# Patient Record
Sex: Female | Born: 1935 | Race: White | Hispanic: No | Marital: Single | Smoking: Never smoker
Health system: Southern US, Community
[De-identification: ages and names within clinical notes are randomized; demographics above are authoritative.]

## PROBLEM LIST (undated history)

## (undated) DIAGNOSIS — I10 Essential (primary) hypertension: Secondary | ICD-10-CM

## (undated) DIAGNOSIS — E785 Hyperlipidemia, unspecified: Secondary | ICD-10-CM

## (undated) DIAGNOSIS — Z973 Presence of spectacles and contact lenses: Secondary | ICD-10-CM

## (undated) DIAGNOSIS — C189 Malignant neoplasm of colon, unspecified: Secondary | ICD-10-CM

## (undated) DIAGNOSIS — N8110 Cystocele, unspecified: Secondary | ICD-10-CM

## (undated) DIAGNOSIS — C50919 Malignant neoplasm of unspecified site of unspecified female breast: Secondary | ICD-10-CM

## (undated) DIAGNOSIS — F32A Depression, unspecified: Secondary | ICD-10-CM

## (undated) HISTORY — PX: BREAST LUMPECTOMY WITH NEEDLE LOCALIZATION AND AXILLARY LYMPH NODE DISSECTION: SHX5758

## (undated) HISTORY — DX: Hyperlipidemia, unspecified: E78.5

## (undated) HISTORY — PX: COLON SURGERY: SHX602

## (undated) HISTORY — DX: Depression, unspecified: F32.A

## (undated) HISTORY — PX: PARTIAL COLECTOMY: SHX5273

## (undated) HISTORY — PX: CATARACT EXTRACTION W/ INTRAOCULAR LENS  IMPLANT, BILATERAL: SHX1307

## (undated) HISTORY — PX: COLONOSCOPY: SHX174

## (undated) HISTORY — PX: LAPAROSCOPIC SIGMOID COLECTOMY: SHX5928

## (undated) HISTORY — PX: BREAST SURGERY: SHX581

## (undated) HISTORY — PX: LAPAROSCOPIC BILATERAL SALPINGO OOPHERECTOMY: SHX5890

---

## 1998-12-13 ENCOUNTER — Other Ambulatory Visit: Admission: RE | Admit: 1998-12-13 | Discharge: 1998-12-13 | Payer: Self-pay | Admitting: Obstetrics & Gynecology

## 2000-03-11 ENCOUNTER — Other Ambulatory Visit: Admission: RE | Admit: 2000-03-11 | Discharge: 2000-03-11 | Payer: Self-pay | Admitting: Obstetrics & Gynecology

## 2001-03-19 ENCOUNTER — Other Ambulatory Visit: Admission: RE | Admit: 2001-03-19 | Discharge: 2001-03-19 | Payer: Self-pay | Admitting: Obstetrics & Gynecology

## 2002-04-15 ENCOUNTER — Other Ambulatory Visit: Admission: RE | Admit: 2002-04-15 | Discharge: 2002-04-15 | Payer: Self-pay | Admitting: Obstetrics & Gynecology

## 2002-10-06 DIAGNOSIS — Z853 Personal history of malignant neoplasm of breast: Secondary | ICD-10-CM

## 2002-10-06 HISTORY — DX: Personal history of malignant neoplasm of breast: Z85.3

## 2003-05-26 ENCOUNTER — Encounter: Admission: RE | Admit: 2003-05-26 | Discharge: 2003-05-26 | Payer: Self-pay | Admitting: Obstetrics & Gynecology

## 2003-05-26 ENCOUNTER — Other Ambulatory Visit: Admission: RE | Admit: 2003-05-26 | Discharge: 2003-05-26 | Payer: Self-pay | Admitting: Obstetrics & Gynecology

## 2003-05-26 ENCOUNTER — Encounter: Payer: Self-pay | Admitting: Obstetrics & Gynecology

## 2003-05-31 ENCOUNTER — Encounter: Payer: Self-pay | Admitting: General Surgery

## 2003-05-31 ENCOUNTER — Encounter (HOSPITAL_COMMUNITY): Admission: RE | Admit: 2003-05-31 | Discharge: 2003-08-29 | Payer: Self-pay | Admitting: General Surgery

## 2003-06-01 ENCOUNTER — Encounter: Payer: Self-pay | Admitting: General Surgery

## 2003-06-02 ENCOUNTER — Encounter: Admission: RE | Admit: 2003-06-02 | Discharge: 2003-06-02 | Payer: Self-pay | Admitting: General Surgery

## 2003-06-02 ENCOUNTER — Encounter: Payer: Self-pay | Admitting: General Surgery

## 2003-06-05 ENCOUNTER — Encounter (INDEPENDENT_AMBULATORY_CARE_PROVIDER_SITE_OTHER): Payer: Self-pay | Admitting: *Deleted

## 2003-06-05 ENCOUNTER — Encounter: Admission: RE | Admit: 2003-06-05 | Discharge: 2003-06-05 | Payer: Self-pay | Admitting: General Surgery

## 2003-06-05 ENCOUNTER — Ambulatory Visit (HOSPITAL_BASED_OUTPATIENT_CLINIC_OR_DEPARTMENT_OTHER): Admission: RE | Admit: 2003-06-05 | Discharge: 2003-06-05 | Payer: Self-pay | Admitting: General Surgery

## 2003-06-05 ENCOUNTER — Encounter: Payer: Self-pay | Admitting: General Surgery

## 2003-06-19 ENCOUNTER — Encounter: Payer: Self-pay | Admitting: General Surgery

## 2003-06-19 ENCOUNTER — Encounter: Admission: RE | Admit: 2003-06-19 | Discharge: 2003-06-19 | Payer: Self-pay | Admitting: General Surgery

## 2003-06-20 ENCOUNTER — Ambulatory Visit: Admission: RE | Admit: 2003-06-20 | Discharge: 2003-08-05 | Payer: Self-pay | Admitting: *Deleted

## 2003-07-04 ENCOUNTER — Encounter: Admission: RE | Admit: 2003-07-04 | Discharge: 2003-07-04 | Payer: Self-pay | Admitting: General Surgery

## 2003-07-04 ENCOUNTER — Encounter: Payer: Self-pay | Admitting: General Surgery

## 2003-10-04 ENCOUNTER — Encounter: Admission: RE | Admit: 2003-10-04 | Discharge: 2003-10-04 | Payer: Self-pay | Admitting: Thoracic Surgery

## 2003-11-11 ENCOUNTER — Ambulatory Visit: Admission: RE | Admit: 2003-11-11 | Discharge: 2004-01-12 | Payer: Self-pay | Admitting: *Deleted

## 2004-02-23 ENCOUNTER — Ambulatory Visit: Admission: RE | Admit: 2004-02-23 | Discharge: 2004-02-23 | Payer: Self-pay | Admitting: *Deleted

## 2004-03-05 ENCOUNTER — Encounter: Admission: RE | Admit: 2004-03-05 | Discharge: 2004-03-05 | Payer: Self-pay | Admitting: Thoracic Surgery

## 2004-05-27 ENCOUNTER — Encounter: Admission: RE | Admit: 2004-05-27 | Discharge: 2004-05-27 | Payer: Self-pay | Admitting: Oncology

## 2004-06-18 ENCOUNTER — Other Ambulatory Visit: Admission: RE | Admit: 2004-06-18 | Discharge: 2004-06-18 | Payer: Self-pay | Admitting: Obstetrics & Gynecology

## 2004-08-27 ENCOUNTER — Encounter: Admission: RE | Admit: 2004-08-27 | Discharge: 2004-08-27 | Payer: Self-pay | Admitting: Thoracic Surgery

## 2004-10-06 DIAGNOSIS — Z85038 Personal history of other malignant neoplasm of large intestine: Secondary | ICD-10-CM

## 2004-10-06 HISTORY — DX: Personal history of other malignant neoplasm of large intestine: Z85.038

## 2004-11-29 ENCOUNTER — Ambulatory Visit: Payer: Self-pay | Admitting: Oncology

## 2004-12-05 ENCOUNTER — Ambulatory Visit (HOSPITAL_COMMUNITY): Admission: RE | Admit: 2004-12-05 | Discharge: 2004-12-05 | Payer: Self-pay | Admitting: Gastroenterology

## 2004-12-05 ENCOUNTER — Encounter (INDEPENDENT_AMBULATORY_CARE_PROVIDER_SITE_OTHER): Payer: Self-pay | Admitting: Specialist

## 2004-12-18 ENCOUNTER — Encounter (INDEPENDENT_AMBULATORY_CARE_PROVIDER_SITE_OTHER): Payer: Self-pay | Admitting: Specialist

## 2004-12-18 ENCOUNTER — Inpatient Hospital Stay (HOSPITAL_COMMUNITY): Admission: RE | Admit: 2004-12-18 | Discharge: 2004-12-21 | Payer: Self-pay | Admitting: General Surgery

## 2005-03-04 ENCOUNTER — Encounter: Admission: RE | Admit: 2005-03-04 | Discharge: 2005-03-04 | Payer: Self-pay | Admitting: Thoracic Surgery

## 2005-05-30 ENCOUNTER — Encounter: Admission: RE | Admit: 2005-05-30 | Discharge: 2005-05-30 | Payer: Self-pay | Admitting: General Surgery

## 2005-07-25 ENCOUNTER — Other Ambulatory Visit: Admission: RE | Admit: 2005-07-25 | Discharge: 2005-07-25 | Payer: Self-pay | Admitting: Obstetrics & Gynecology

## 2005-08-01 ENCOUNTER — Encounter: Admission: RE | Admit: 2005-08-01 | Discharge: 2005-08-01 | Payer: Self-pay | Admitting: Family Medicine

## 2005-08-18 ENCOUNTER — Encounter: Admission: RE | Admit: 2005-08-18 | Discharge: 2005-08-18 | Payer: Self-pay | Admitting: Family Medicine

## 2005-10-08 ENCOUNTER — Encounter (INDEPENDENT_AMBULATORY_CARE_PROVIDER_SITE_OTHER): Payer: Self-pay | Admitting: Specialist

## 2005-10-08 ENCOUNTER — Ambulatory Visit (HOSPITAL_COMMUNITY): Admission: RE | Admit: 2005-10-08 | Discharge: 2005-10-08 | Payer: Self-pay | Admitting: Obstetrics & Gynecology

## 2006-06-11 ENCOUNTER — Encounter: Admission: RE | Admit: 2006-06-11 | Discharge: 2006-06-11 | Payer: Self-pay | Admitting: Obstetrics & Gynecology

## 2007-05-09 IMAGING — CT CT ABDOMEN W/ CM
1 of 3 series · 14 of 32 positions shown, 19 images · IV contrast (READICAT/WATER & [ID] OMNI 300)
Comparison: Chest CT 03/04/2005

ABDOMEN CT WITH CONTRAST

CLINICAL DATA: Elevated liver enzymes. History of breast cancer and colon
cancer.
TECHNIQUE: Multidetector CT imaging of the abdomen and pelvis was performed
following the standard protocol during bolus administration of intravenous
contrast.

Contrast:  100 cc Omnipaque 300

[Series 2: — · axial · 0.70mm/px · z∈[-418,-8]mm · 14 of 94 slices shown, 19 images]
[im 6/94  soft-tissue]
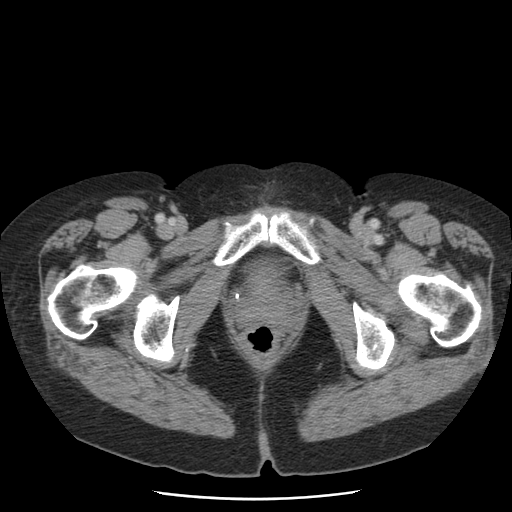
[im 6/94  bone]
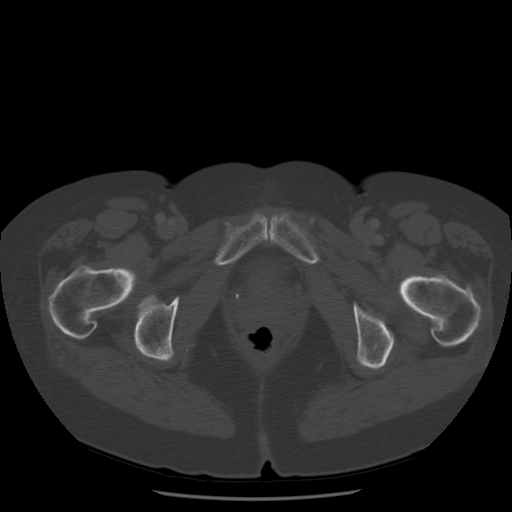
[im 11/94  soft-tissue]
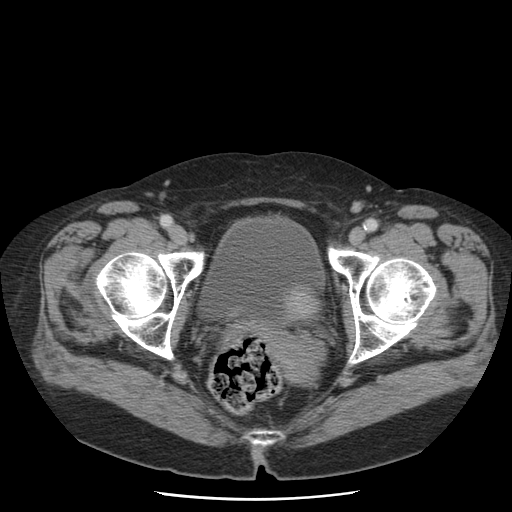
[im 21/94  soft-tissue]
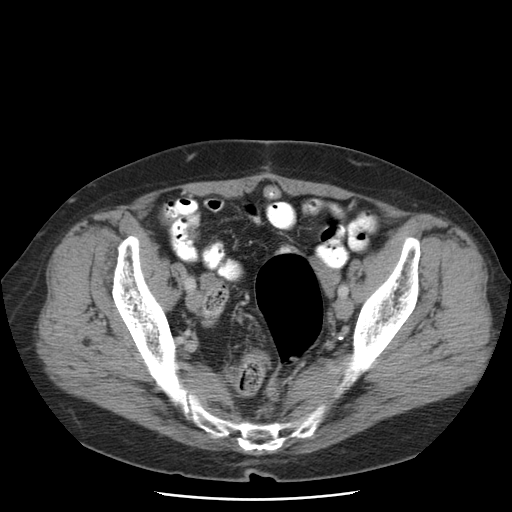
[im 26/94  soft-tissue]
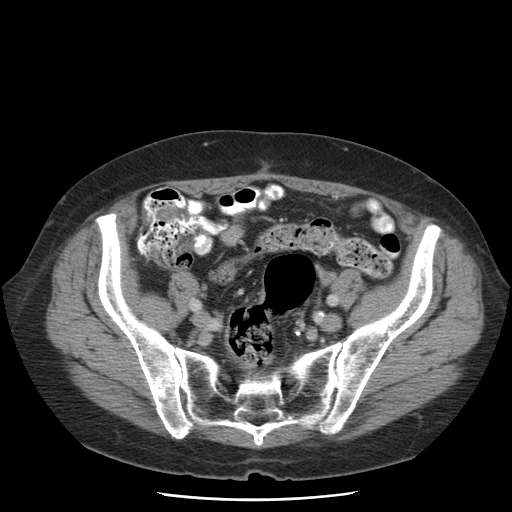
[im 32/94  soft-tissue]
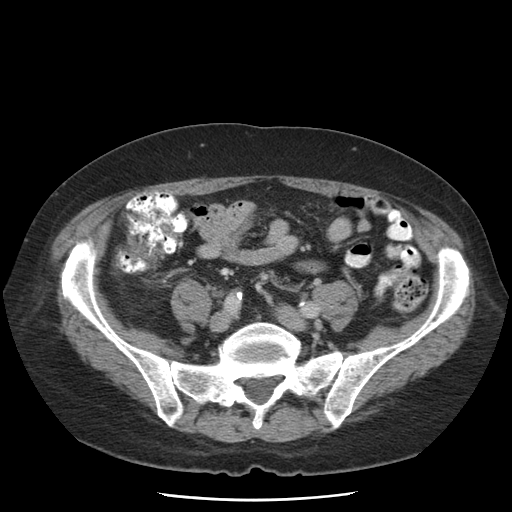
[im 42/94  soft-tissue]
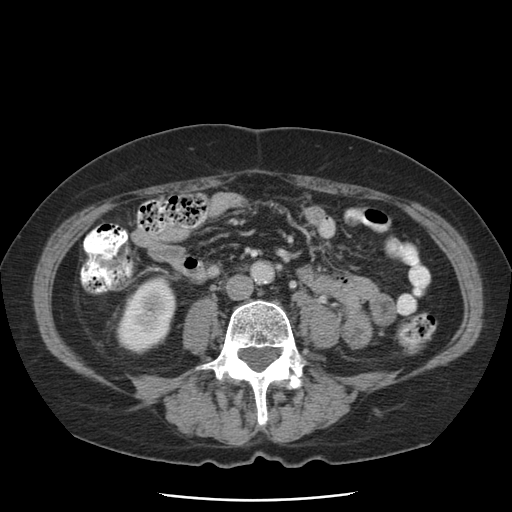
[im 47/94  soft-tissue]
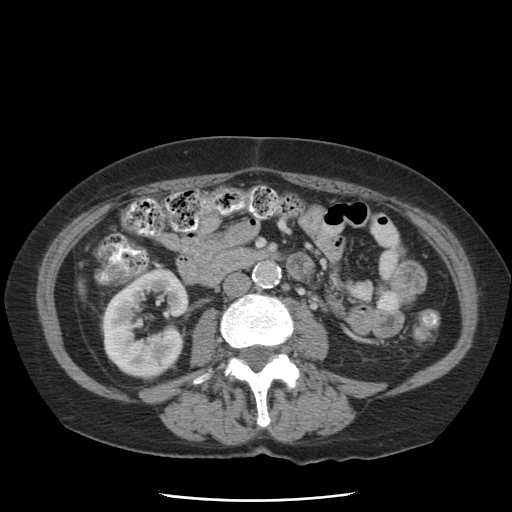
[im 52/94  soft-tissue]
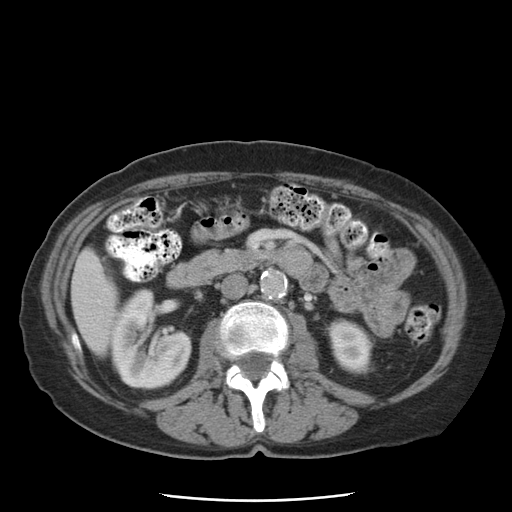
[im 63/94  soft-tissue]
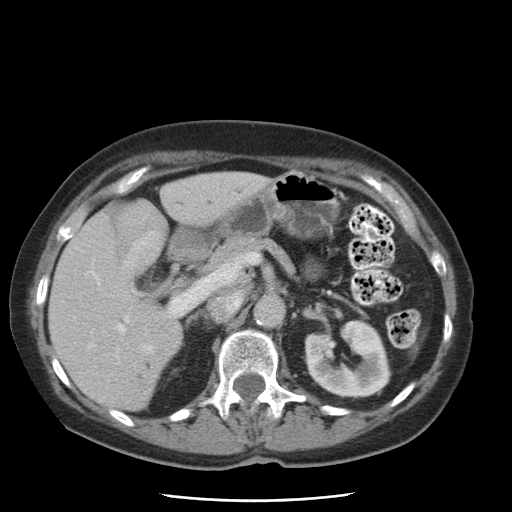
[im 63/94  bone]
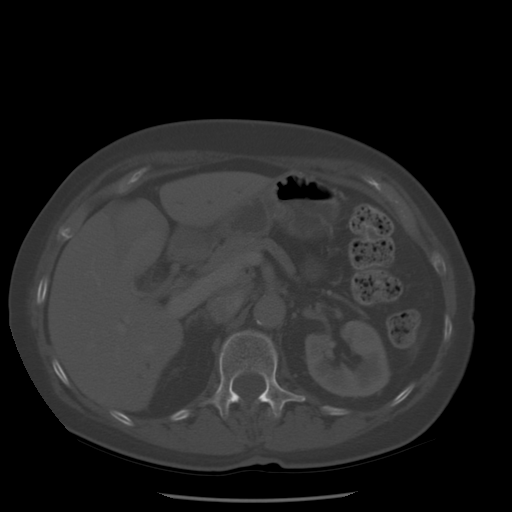
[im 68/94  soft-tissue]
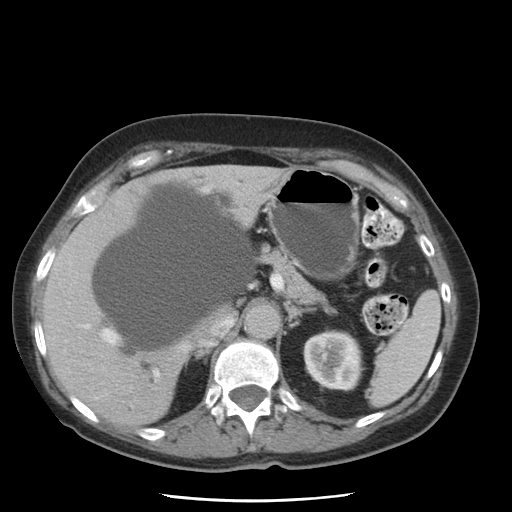
[im 73/94  soft-tissue]
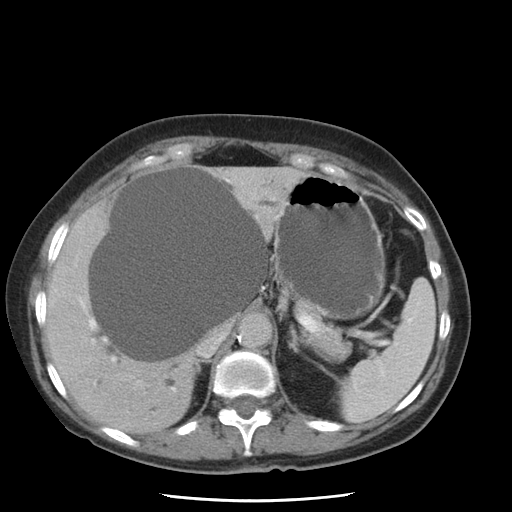
[im 73/94  lung]
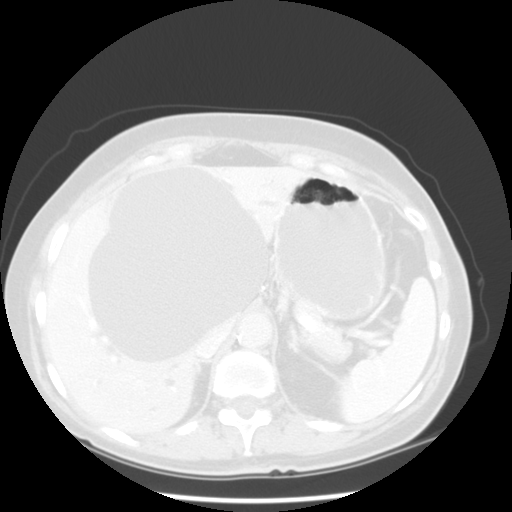
[im 78/94  lung]
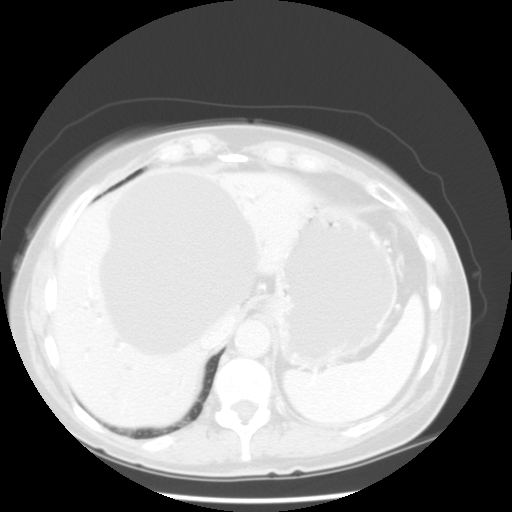
[im 83/94  soft-tissue]
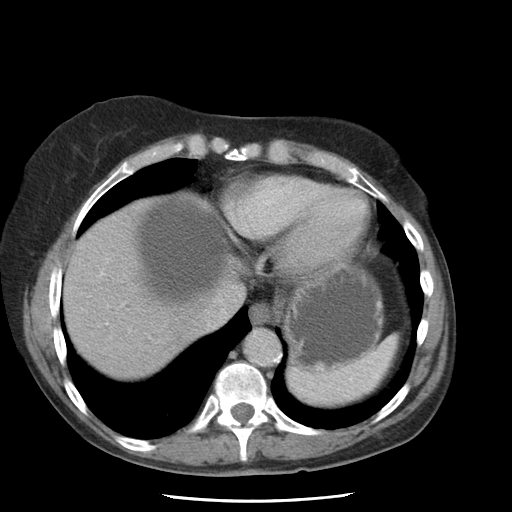
[im 83/94  lung]
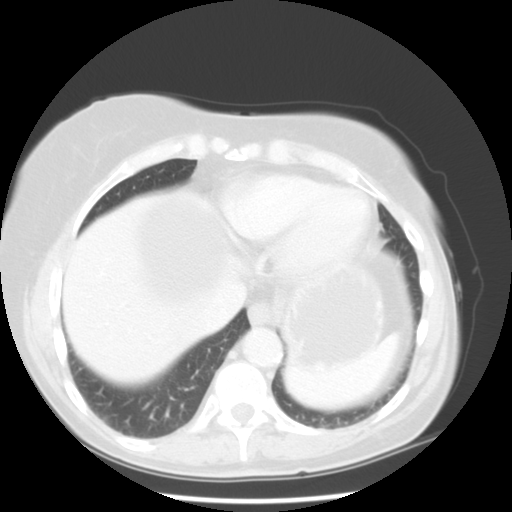
[im 88/94  soft-tissue]
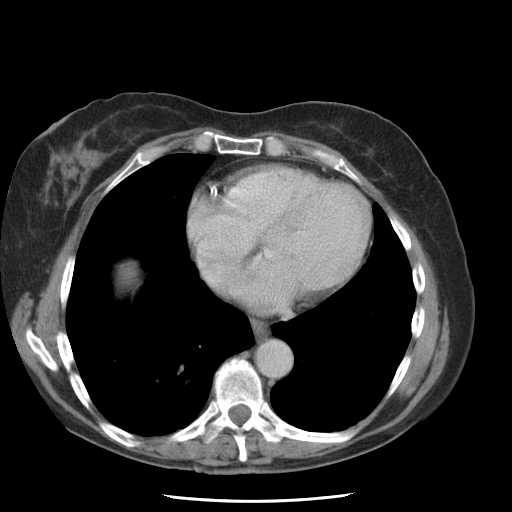
[im 88/94  lung]
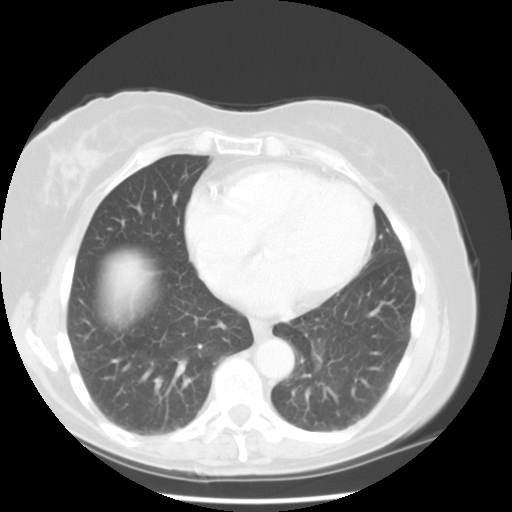

[14 of 32 positions shown; findings below may reference images not displayed]

FINDINGS: Again noted is a large central simple appearing hepatic cyst. This
may be slightly increased in size since prior chest CT. This now measures 12.3 x
13.8 cm, compared to 11.8 x 13.3 cm previously. This remains water density.
Stable intrahepatic biliary ductal dilatation. Spleen, pancreas, adrenals,
kidneys unremarkable. Bowel grossly unremarkable. No free fluid, free air, or
adenopathy.

8mm nodule again noted in the inferior right middle lobe just above the right
hemidiaphragm, stable since prior study. Left lung base clear.

IMPRESSION

Slight increase in the size of the large central hepatic cysts. Stable
intrahepatic biliary ductal dilatation. Maximum diameter now 13.8 cm.

PELVIS CT WITH CONTRAST
FINDINGS: No free fluid, free air, or adenopathy. 1.6 cm low density area
within the right adnexa, likely a small right ovarian cyst. Probable fibroid
within the uterus. However I would recommend pelvic ultrasound to confirm both
of the above findings.

IMPRESSION

4.2 cm slightly hyperdense area within the uterus which may represent a fibroid,
but recommend ultrasound confirmation. 1.6 cm low density lesion within the
right ovary. This can be evaluated with ultrasound as well.

## 2007-06-16 ENCOUNTER — Encounter: Admission: RE | Admit: 2007-06-16 | Discharge: 2007-06-16 | Payer: Self-pay | Admitting: Obstetrics & Gynecology

## 2008-06-16 ENCOUNTER — Encounter: Admission: RE | Admit: 2008-06-16 | Discharge: 2008-06-16 | Payer: Self-pay | Admitting: General Surgery

## 2009-06-18 ENCOUNTER — Encounter: Admission: RE | Admit: 2009-06-18 | Discharge: 2009-06-18 | Payer: Self-pay | Admitting: Family Medicine

## 2009-09-03 ENCOUNTER — Encounter: Admission: RE | Admit: 2009-09-03 | Discharge: 2009-09-03 | Payer: Self-pay | Admitting: Obstetrics & Gynecology

## 2010-06-20 ENCOUNTER — Encounter: Admission: RE | Admit: 2010-06-20 | Discharge: 2010-06-20 | Payer: Self-pay | Admitting: Family Medicine

## 2011-02-21 NOTE — Op Note (Signed)
Kathy Carpenter, Kathy Carpenter                ACCOUNT NO.:  [id number]   MEDICAL RECORD NO.:  [id number]          PATIENT TYPE:  INP   LOCATION:  X004                         FACILITY:  [facility]   PHYSICIAN:  Ollen Gross. Vernell Morgans, M.D. DATE OF BIRTH:  December 27, 1934   DATE OF PROCEDURE:  12/18/2004  DATE OF DISCHARGE:                                 OPERATIVE REPORT   PREOPERATIVE DIAGNOSIS:  Sigmoid colon cancer.   POSTOPERATIVE DIAGNOSIS:  Sigmoid colon cancer.   PROCEDURE:  Laparoscopic-assisted sigmoid colectomy and rigid sigmoidoscopy.   SURGEON:  Ollen Gross. Carolynne Edouard, M.D.   ASSISTANT:  Lebron Conners, M.D.   ANESTHESIA:  General endotracheal.   PROCEDURE:  After informed consent was obtained, the patient was brought to  the operating room and placed in a supine position on the operating room  table.  After the adequate induction of general anesthesia, the patient was  placed in the lithotomy position, and her abdomen and perineum were prepped  with Betadine and draped in the usual sterile manner.  The area just above  the umbilicus on the midline was infiltrated with 0.25% Marcaine.  A small  incision was made with a 15 blade knife.  This incision was carried down  through the subcutaneous tissue bluntly with a Kelly clamp and Omni  retractors until the linea alba was identified.  The linea alba was incised  with a 15 blade knife, and each side was grasped with Kocher clamps and  elevated anteriorly.  The pre-peritoneal space was then entered bluntly with  a hemostat until the peritoneum was opened and access was gained into the  abdominal cavity.  A 0 Vicryl purse-string stitch was placed in the fascia  surrounding the opening.  A Hasson cannula was placed through the opening  and anchored in placed with the previously placed Vicryl purse-string  stitch.  The abdomen was then insufflated with carbon dioxide without  difficulty.  The laparoscope was placed through the Hasson cannula, and the  pelvis  and left lower quadrant were examined.  The patient was placed in  Trendelenburg position and rotated with the left side up.  This allowed  pretty good visualization of the sigmoid colon.  Next, in the right lower  quadrant, a site for a 5 mm port was chosen.  The area was infiltrated with  0.25% Marcaine, and a small incision was made with a 15 blade knife, and a 5  mm port was placed bluntly through this incision into the abdominal cavity  under direct vision.  Another site in the left mid abdomen was chosen for  another port.  This area was infiltrated with 0.25% Marcaine, and a small  incision was made with a 15 blade knife.  A 10 mm port was placed bluntly  through this incision into the abdominal cavity under direct vision.  The  laparoscope was then moved to the left mid abdomen port and blunt graspers  were used to run the sigmoid colon down towards the rectum.  No area that  had been tattooed with [place] ink could be identified.  At this  point, I  performed a rigid sigmoidoscopy and using the rigid sigmoidoscope, I was  able to identify the area with [place] ink.  This area was grasped with a  Kingsley Spittle and then marked with an endo stitch.  Next, the sigmoid colon was  mobilized by incising its retroperitoneal attachment along the white line of  Toldt using the harmonic scalpel.  Once this was accomplished, the sigmoid  colon was extremely floppy and was well mobilized.  At this point, a lower  midline incision was made with a 10 blade knife.  This incision was carried  down through the skin and subcutaneous tissues sharply until the linea alba  was identified.  The linea alba was also incised with the electrocautery.  The pre-peritoneal space was opened sharply with electrocautery as well  until the abdomen was entered, and the rest of the incision was opened under  direct vision.  The sigmoid colon was easy to identify through this small  incision and was very mobile and able to be  brought out through this  incision.  The area with the stitch was identified.  A site was chosen, both  proximal and distal to where the stitch was that allowed a good margin, and  the mesentery at each of these points was opened sharply with the  electrocautery.  Allen clamps were placed across the colon, both proximally  and distally.  The mesentery to this segment of sigmoid colon was then taken  down by serially clamping the vessels of this mesentery, dividing with  Metzenbaum scissors and ligating with 2-0 silk ties in each of these vessels  of the mesentery, taking them somewhat down low on the mesentery but  preserving the blood supply to the rest of the colon and rectum.  Once this  was accomplished, the colon was divided proximally and distally on the Allen  clamp and removed and sent to pathology for further evaluation.  The Allen  clamps were removed.  The bowel wall appeared to be healthy.  The inside of  the bowel was inspected, and there were no polyp or [place] ink areas noted  that were visualized in that area.  An end-to-end anastomosis was then  created using full-thickness interrupted 3-0 silk stitches, keeping the  knots on the inside.  The posterior wall was done first, followed by the  anterior wall.  Once this was accomplished, the anastomosis appeared to be  healthy, without any tension, and widely patent.  The mesenteric defect was  closed with interrupted 2-0 silk stitches.  The bowel was then allowed to  drop back into the abdominal cavity.  The head of the abdominal cavity was  irrigated with copious amounts of saline.  The lower midline incision was  then closed with a running #1 PDS.  Insufflation was then returned to the  abdomen, and the laparoscope was placed through the Hasson cannula.  The  lower pelvis area was inspected.  The midline incision was closed nicely,  and the operative area appeared to be hemostatic.  No other abnormalities were noted on quick  visualization of the rest of the abdominal cavity.  The  Hasson cannula was then removed, and the fascial defect was closed with the  previously placed Vicryl purse-string stitch as well as with another figure-  of-eight 0 Vicryl stitch.  This was done under direct vision with the  laparoscope in the left lateral abdominal port.  The rest of the ports were  then removed under direct  vision.  The gas was allowed to escape.  All areas  were completely hemostatic.  The skin incisions were closed with staples  after irrigating with Betadine and saline, and sterile dressings were  applied.  The patient tolerated the procedure well.  At the end of the case,  all sponge, needle, and instrument counts were correct.  The patient was  then awakened and taken to the recovery room in stable condition.      PST/MEDQ  D:  12/18/2004  T:  12/18/2004  Job:  [id number]

## 2011-02-21 NOTE — Discharge Summary (Signed)
NAMEJOHNISHA, LOUKS                ACCOUNT NO.:  [id number]   MEDICAL RECORD NO.:  [id number]          PATIENT TYPE:  INP   LOCATION:  0466                         FACILITY:  [facility]   PHYSICIAN:  Ollen Gross. Vernell Morgans, M.D. DATE OF BIRTH:  1935/07/03   DATE OF ADMISSION:  12/18/2004  DATE OF DISCHARGE:  12/21/2004                                 DISCHARGE SUMMARY   Ms. Marcus is a 76 year old white female, who was found on screening  colonoscopy to have a sigmoid colon cancer.  She underwent a laparoscopic-  assisted sigmoid colectomy on March 15.  She tolerated the operation well.  She was started on liquids on March 16.  On March 17, she had good bowel  sounds and, on March 18, she was passing flatus and tolerating a diet, was  ready for discharge home.   DISCHARGE MEDICATIONS:  1. She is to resume her home medications.  2. She was given a prescription for Vicodin for pain.     ACTIVITY:  No heavy lifting.   DIET:  No restrictions.   FINAL DIAGNOSIS:  Sigmoid colon cancer.   CONDITION:  Stable.   FOLLOW UP:  With Dr. Carolynne Edouard in a week for staple removal, and she is  discharged home.      PST/MEDQ  D:  01/28/2005  T:  01/28/2005  Job:  [id number]

## 2011-02-21 NOTE — Op Note (Signed)
NAME:  Kathy Carpenter, Kathy Carpenter                ACCOUNT NO.:  [id number]   MEDICAL RECORD NO.:  [id number]          PATIENT TYPE:  AMB   LOCATION:  SDC                           FACILITY:  [facility]   PHYSICIAN:  Ilda Mori, M.D.   DATE OF BIRTH:  May 25, 1935   DATE OF PROCEDURE:  10/08/2005  DATE OF DISCHARGE:                                 OPERATIVE REPORT   PREOPERATIVE DIAGNOSIS:  A complex right adnexal mass.   POSTOPERATIVE DIAGNOSIS:  A complex right adnexal mass.   PROCEDURE:  Laparoscopic bilateral salpingo-oophorectomy.   SURGEON:  Ilda Mori, M.D.   ASSISTANT:  Luvenia Redden, M.D.   ANESTHESIA:  General.   ESTIMATED BLOOD LOSS:  Minimal.   FINDINGS:  The right ovary was slightly enlarged but no cyst was seen. There  was a small excrescence over the right ovary. The tubes appeared perfectly  normal.  The uterus appeared normal. The pelvis was free of adhesions or  pathology. The upper abdomen appeared normal. The gallbladder was full but  appeared normal and her liver edge looked completely normal.   INDICATIONS:  This is a 76 year old woman with a history of breast cancer  diagnosed in 2004 and colon cancer diagnosed in 2006. During a CT scan for  abdominal symptomatology, a complex right ovarian cyst was noted with blood  flow in small nodules within the cyst.  A CA-125 was done and came back  normal. The case was discussed with Dr. De Blanch and radiology,  Dr. Elly Modena.  It was felt that with this patient's history of cancer  and a suspicious nodule, that removal of the ovary was indicated. These  findings were discussed with the patient who elected to proceed. Because it  was felt that this was most likely a benign lesion, the decision was made to  do a two-step procedure proceeding with laparoscopic oophorectomy as minimal  invasive surgery.  If on permanent section the right ovary was indeed  cancerous, then a staging laparotomy would be performed by  the GYN  oncologist.   PROCEDURE:  The patient was taken to the operating room and placed in the  supine position and general endotracheal anesthesia was induced. She was  then placed in modified dorsal supine position and the lower abdomen,  umbilical area, and vulva and vagina were prepped and draped in sterile  fashion. The bladder was emptied and intact.  A tenaculum was placed through  the endocervical canal and affixed to the anterior lip of the cervix for  uterine mobility.  The surgeon re-gowned and gloved.  An incision was made  at the base of the umbilicus and carried down to the fascia. The fascia was  then sharply incised. It was sutured with a loose pursestring suture. The  peritoneum was entered and a Hasson cannula was placed into the peritoneal  cavity. A pneumoperitoneum was created. Accessory instruments were placed in  the right and left lower quadrants lateral to the epigastric vessels. The  pelvis was viewed with the findings noted above. Pelvic washings were  obtained.  The ureters were identified  bilaterally. The infundibulopelvic  ligament was then clamped with a tripolar cautery unit, cauterized and cut.  The dissection was then carried removing down to the uterine ovarian  anastomosis so that the tube and ovary were free on the right side. The  specimen was then placed in the anterior cul-de-sac.  An identical procedure  was then carried out on the left adnexa. The laparoscope was then removed  from the umbilical port. A bag was placed in the umbilical port. The  laparoscope was placed in the right lower quadrant. The bag was opened and  the ovaries were placed both in the same bag. The bag was then closed and  was removed through the umbilical incision. The pedicles were then inspected  through the 5 mm scope and were dry and the pursestring suture that had been  placed previously was tied to close the fascia at the umbilical site and the  skin over the  umbilical incision was closed with subcuticular 4-0 Dexon  suture. The accessory trocars were then removed in the right and left lower  quadrant and these incisions were closed with Dermabond.  The procedure was  then terminated. The patient left the operating room in good condition.      Ilda Mori, M.D.  Electronically Signed     RK/MEDQ  D:  10/08/2005  T:  10/08/2005  Job:  [id number]

## 2011-02-21 NOTE — Op Note (Signed)
NAME:  Kathy Carpenter, Kathy Carpenter             ACCOUNT NO.:  [id number]   MEDICAL RECORD NO.:  [id number]                   PATIENT TYPE:  AMB   LOCATION:  DSC                                  FACILITY:  [facility]   PHYSICIAN:  Ollen Gross. Vernell Morgans, M.D.              DATE OF BIRTH:  06-18-1935   DATE OF PROCEDURE:  06/05/2003  DATE OF DISCHARGE:                                 OPERATIVE REPORT   PREOPERATIVE DIAGNOSIS:  Left breast cancer.   POSTOPERATIVE DIAGNOSIS:  Left breast cancer.   PROCEDURE:  Left breast needle-localized lumpectomy and sentinel node  biopsy.   SURGEON:  Ollen Gross. Carolynne Edouard, M.D.   ANESTHESIA:  General endotracheal.   DESCRIPTION OF PROCEDURE:  After informed consent was obtained, the patient  was brought to the operating room and placed in the supine position on the  operating table.  After adequate induction of general anesthesia, the  patient's left chest, axilla, and breast area were prepped with Betadine and  draped in the usual sterile manner.  Initially the Neoprobe 2000 was used to  determine the baseline counts.  The breast had a count of about 9000 at the  injection site.  There was a single area in the axilla where increased  uptake was encountered with counts of around 30.  At this point the  Lymphazurin blue dye was injected subareolar and into the dermal tissues in  the inferior outer quadrant of the nipple-areolar complex.  Approximately 5  mL were used.  The breast was then gently massaged for five minutes.  Prior  to coming to the operating room, the patient had had a wire placed within  the tumor in question and care was taken not to disrupt the placement of  this wire.  After five minutes a small incision was made transversely at the  inferior edge of the axilla.  This incision was carried down through the  skin and subcutaneous tissue sharply with the electrocautery.  Blunt  dissection was then carried out into this area and into the axilla.  The  direction of the dissection was determined using the Neoprobe 2000.  Low in  the axilla there was a single lymph node I identified that was bright blue  with the Lymphazurin blue dye.  It also had counts of around 140.  The  efferent and afferent lymphatics to this lymph node were clamped with  hemostats, divided, and ligated with 3-0 Vicryl ties and at this point the  lymph node was removed from the patient.  The background counts in the  axilla at this point were basically zero.  The ex vivo count on the hot blue  lymph node that was identified as sentinel node 1 was about 140.  At this  point the sentinel node was sent to pathology for touch preps and a clean  gauze was packed in the cavity.  Next attention was turned to the breast.  A  curvilinear incision was made just  inferior to the nipple-areolar complex  overlying the area where the tumor was identified.  This incision was  carried down through the skin and subcutaneous tissue into the breast  tissues using Bovie electrocautery.  Sharp dissection with the  electrocautery was used to surround the end of the wire widely in what  appeared to be normal breast tissue visually.  This specimen was then sent  to pathology for touch preps of the margins.  Separate specimens from the  inferior, superior, deep, medial, and lateral margins were sent, all labeled  separately.  These margins were also touch-prepped and after some time,  pathology called back to the room and indicated they thought they saw  atypical cells on the medial margin.  At this point a new medial margin was  removed and labeled 1A as the new medial margin, 1B as the new medial  inferior margin, and 1C as the new medial superior margin.  The rest of  the cavity was inspected and palpated and felt to be all normal breast  tissue.  Hemostasis was achieved using the electrocautery.  The wound was  irrigated with saline.  The skin was then closed with a running 4-0 Monocryl   subcuticular stitch, Benzoin and Steri-Strips were applied.  At this point  gloves were changed and several instruments were kept clean, and these  instruments were used to close the axillary incision using a running 4-0  Monocryl subcuticular stitch.  Again Benzoin and Steri-Strips  and sterile dressings were applied.  The patient tolerated the procedure  well.  At the end of the case all needle, sponge, and instrument counts were  correct.  The patient was then awakened and taken to the recovery room in  stable condition.                                               Ollen Gross. Vernell Morgans, M.D.    PST/MEDQ  D:  06/06/2003  T:  06/06/2003  Job:  [id number]

## 2011-02-21 NOTE — Op Note (Signed)
NAMEAARTI, MANKOWSKI                ACCOUNT NO.:  [id number]   MEDICAL RECORD NO.:  [id number]          PATIENT TYPE:  AMB   LOCATION:  ENDO                         FACILITY:  [facility]   PHYSICIAN:  Bernette Redbird, M.D.   DATE OF BIRTH:  20-Aug-1935   DATE OF PROCEDURE:  12/05/2004  DATE OF DISCHARGE:                                 OPERATIVE REPORT   PROCEDURE:  Flexible sigmoidoscopy with biopsies and directed submucosal  injection.   INDICATIONS:  Mrs. Blahnik is three days status post a screening  colonoscopy, during which time eight polyps were removed.  One of them, in  the lower colon at about 20 or 25 cm, was a semipedunculated, slightly  loculated, not particularly worrisome-appearing, slightly lobulated polyp,  which was snared off after submucosal injection.  It came back with  adenocarcinoma within it, and due to fragmentation of the polyps, it is not  clear whether there was submucosal invasion or a clear margin.  It was  therefore felt important to tattoo the polypectomy site for future reference  in case surgery might be needed, and to obtain further biopsies from the  base of the polypectomy site to look for any evidence of residual polyp  tissue.   FINDINGS:  Eschar readily visible, biopsied and tattooed proximally and  distally.   PROCEDURE:  The patient was familiar with the procedure and provided written  consent. No sedation was administered.  The Olympus adjustable-tension  pediatric video colonoscope was advanced easily following a Fleet's enema  prep, and there was little residual stool present.  The eschar corresponding  to the previous polypectomy site was readily identified and tattooed  proximal and distally with [place] ink, after which approximately 10 biopsies  were obtained from the margins and base of the polypectomy site.   A KUB was obtained with the tip the scope in the region of the polypectomy  site for anatomic localization.   The scope was then  removed from the patient.  She tolerated the procedure  well, and there no apparent complications.   IMPRESSION:  Malignant sigmoid polyp with indeterminate margin.   PLAN:  Await pathology results.  Review previous pathology.  I will consult  with patient's oncologist and decide whether or not surgical resection of  that area is warranted.      RB/MEDQ  D:  12/05/2004  T:  12/05/2004  Job:  [id number]   cc:   Valentino Hue. Magrinat, M.D.  [address] [facility]  [place]  [place] [address]  Fax: [phone]

## 2011-06-30 ENCOUNTER — Other Ambulatory Visit: Payer: Self-pay | Admitting: Family Medicine

## 2011-06-30 DIAGNOSIS — Z1231 Encounter for screening mammogram for malignant neoplasm of breast: Secondary | ICD-10-CM

## 2011-07-07 ENCOUNTER — Ambulatory Visit
Admission: RE | Admit: 2011-07-07 | Discharge: 2011-07-07 | Disposition: A | Discharge status: Home / Self Care | Payer: Medicare Other | Source: Ambulatory Visit | Attending: Family Medicine | Admitting: Family Medicine

## 2011-07-07 DIAGNOSIS — Z1231 Encounter for screening mammogram for malignant neoplasm of breast: Secondary | ICD-10-CM

## 2012-06-24 ENCOUNTER — Other Ambulatory Visit: Payer: Self-pay | Admitting: Obstetrics & Gynecology

## 2012-06-24 DIAGNOSIS — Z1231 Encounter for screening mammogram for malignant neoplasm of breast: Secondary | ICD-10-CM

## 2012-07-09 ENCOUNTER — Ambulatory Visit
Admission: RE | Admit: 2012-07-09 | Discharge: 2012-07-09 | Disposition: A | Discharge status: Home / Self Care | Payer: Medicare Other | Source: Ambulatory Visit | Attending: Obstetrics & Gynecology | Admitting: Obstetrics & Gynecology

## 2012-07-09 DIAGNOSIS — Z1231 Encounter for screening mammogram for malignant neoplasm of breast: Secondary | ICD-10-CM

## 2013-08-30 ENCOUNTER — Other Ambulatory Visit: Payer: Self-pay

## 2013-08-30 DIAGNOSIS — Z1231 Encounter for screening mammogram for malignant neoplasm of breast: Secondary | ICD-10-CM

## 2013-10-10 ENCOUNTER — Ambulatory Visit
Admission: RE | Admit: 2013-10-10 | Discharge: 2013-10-10 | Disposition: A | Discharge status: Home / Self Care | Payer: Medicare Other | Source: Ambulatory Visit

## 2013-10-10 DIAGNOSIS — Z1231 Encounter for screening mammogram for malignant neoplasm of breast: Secondary | ICD-10-CM

## 2019-10-07 DIAGNOSIS — D069 Carcinoma in situ of cervix, unspecified: Secondary | ICD-10-CM

## 2019-10-07 HISTORY — DX: Carcinoma in situ of cervix, unspecified: D06.9

## 2019-10-24 ENCOUNTER — Ambulatory Visit: Payer: Self-pay

## 2019-10-25 ENCOUNTER — Ambulatory Visit: Payer: Medicare Other | Attending: Internal Medicine

## 2019-10-25 DIAGNOSIS — Z23 Encounter for immunization: Secondary | ICD-10-CM | POA: Insufficient documentation

## 2019-10-25 NOTE — Progress Notes (Signed)
   Covid-19 Vaccination Clinic  Name:  Sherrell Higham    MRN: [id number] DOB: 06/06/1935  10/25/2019  Ms. Hobbie was observed post Covid-19 immunization for 15 minutes without incidence. She was provided with Vaccine Information Sheet and instruction to access the V-Safe system.   Ms. Banken was instructed to call 911 with any severe reactions post vaccine: Marland Kitchen Difficulty breathing  . Swelling of your face and throat  . A fast heartbeat  . A bad rash all over your body  . Dizziness and weakness    Immunizations Administered    Name Date Dose VIS Date Route   Pfizer COVID-19 Vaccine 10/25/2019  3:02 PM 0.3 mL 09/16/2019 Intramuscular   Manufacturer: [place]   Lot: [id number]   [place]: [id number]

## 2019-11-14 ENCOUNTER — Ambulatory Visit: Payer: Medicare Other | Attending: Internal Medicine

## 2019-11-14 DIAGNOSIS — Z23 Encounter for immunization: Secondary | ICD-10-CM | POA: Insufficient documentation

## 2019-11-14 NOTE — Progress Notes (Signed)
   Covid-19 Vaccination Clinic  Name:  Kathy Carpenter    MRN: [id number] DOB: June 05, 1935  11/14/2019  [name] was observed post Covid-19 immunization for 15 minutes without incidence. She was provided with Vaccine Information Sheet and instruction to access the V-Safe system.   [name] was instructed to call 911 with any severe reactions post vaccine: Marland Kitchen Difficulty breathing  . Swelling of your face and throat  . A fast heartbeat  . A bad rash all over your body  . Dizziness and weakness    Immunizations Administered    Name Date Dose VIS Date Route   Pfizer COVID-19 Vaccine 11/14/2019  4:46 PM 0.3 mL 09/16/2019 Intramuscular   Manufacturer: [place]   Lot: [id number]   [place]: [id number]

## 2020-05-30 LAB — LAB REPORT - SCANNED: EGFR: 54

## 2020-06-06 DIAGNOSIS — D069 Carcinoma in situ of cervix, unspecified: Secondary | ICD-10-CM

## 2020-06-06 HISTORY — DX: Carcinoma in situ of cervix, unspecified: D06.9

## 2020-06-07 ENCOUNTER — Encounter (HOSPITAL_BASED_OUTPATIENT_CLINIC_OR_DEPARTMENT_OTHER): Payer: Self-pay | Admitting: Obstetrics and Gynecology

## 2020-06-07 NOTE — H&P (Signed)
Kathy Carpenter is an 85 y.o. female presenting for surgical managment of CIN 3 and cystocele. She was referred due to cystocele and diagnosed with stage 3 anterior prolapse w/out uterine prolapse. She had a pap smear at that visit which was abnormal. She underwent a colposcopy and EMB given atypical glandular cells in postmenopausal women. THis returned with CIN 3 in all biopsies including ECC. She had a UTI at that time which was treated with bactrim. A TOC was done and normal. She was counseled in the office regarding her options and elected to have a CKC and anterior repair with possible cystoscopy. SHe was medically cleared by her PCP as her BP is well controlled.  Pertinent Gynecological History: Menses: post-menopausal Bleeding: n/a Contraception: post menopausal status DES exposure: denies Blood transfusions: none Sexually transmitted diseases: no past history Previous GYN Procedures: none  Last pap: abnormal: atypical glanduar cells Date: 05/2020 OB History: G3, P3 SVD x3   Menstrual History: No LMP recorded. Patient is postmenopausal.   Past Medical History:  Diagnosis Date  . Breast cancer ([place])   . Colon cancer ([place])    s/p [place] colectomy  . Cystocele with prolapse   . High grade squamous intraepithelial lesion (HGSIL), grade 3 CIN, on biopsy of cervix   . HTN (hypertension)     Past Surgical History:  Procedure Laterality Date  . BILATERAL SALPINGOOPHORECTOMY     vertical midline  . BREAST LUMPECTOMY    . LAPAROSCOPIC PARTIAL COLECTOMY      No family history on file.  Social History:  has no history on file for tobacco use, alcohol use, and drug use.  Allergies: No Known Allergies  No medications prior to admission.     Review of Systems  There were no vitals taken for this visit. Physical Exam  Gen: well appearing, NAD CV: Reg rate Pulm: NWOB Abd: soft, nondistended, nontender, no masses, prior incisions well healed GYN: stage 3 cystocele, no  uterine prolapse, good cervix that is normal sized. no adnexa ttp/CMT Ext: No edema b/l  No results found for this or any previous visit (from the past 24 hour(s)).  No results found.  Assessment/Plan: 39 with anterior prolapse and CIN 3 presenting for surgical management. Planning an anterior repair and cold knife conization, possible posterior repair, and possible cystoscopy. Risks were reviewed including infection, bleeding, damage to surrounding structures, need for additional procedures, conversion to an open procedure, future prolapse, failure, continued UI, pain s/s scarring, Urinary retention,and thromboembolic disease. Risks of CKC discussed in detail to include but not limited to bleeding more than anticipated, blood transfusion, diagnosis of cancer, infection, chronic pain, recurrent cervical dysplasia, positive margins, and need for additional cervical procedures or a hysterectomy. We also discussed the possibility of positive margins on the CKC specimen and how we usually handle follow-up care in that situation. We reviewed pelvic rest x at lest 6 weeks postop.    Tyson Dense 06/07/2020, 3:30 PM

## 2020-06-21 ENCOUNTER — Other Ambulatory Visit: Payer: Self-pay

## 2020-06-21 ENCOUNTER — Encounter (HOSPITAL_BASED_OUTPATIENT_CLINIC_OR_DEPARTMENT_OTHER): Payer: Self-pay | Admitting: Obstetrics and Gynecology

## 2020-06-21 NOTE — Progress Notes (Signed)
Spoke w/ via phone for pre-op interview--- PT Lab needs dos----Istat and EKG               Lab results------ no COVID test ------ 06-22-2020 @ 0930 Arrive at ------- 0530 NPO after MN NO Solid Food.  Clear liquids from MN until--- 0430 Medications to take morning of surgery ----- NONE Diabetic medication ----- n/a Patient Special Instructions ----- n/a Pre-Op special Istructions ----- n/a Patient verbalized understanding of instructions that were given at this phone interview. Patient denies shortness of breath, chest pain, fever, cough at this phone interview.

## 2020-06-22 ENCOUNTER — Other Ambulatory Visit (HOSPITAL_COMMUNITY)
Admission: RE | Admit: 2020-06-22 | Discharge: 2020-06-22 | Disposition: A | Discharge status: Home / Self Care | Payer: Medicare Other | Source: Ambulatory Visit | Attending: Obstetrics and Gynecology | Admitting: Obstetrics and Gynecology

## 2020-06-22 DIAGNOSIS — Z01812 Encounter for preprocedural laboratory examination: Secondary | ICD-10-CM | POA: Insufficient documentation

## 2020-06-22 DIAGNOSIS — Z20822 Contact with and (suspected) exposure to covid-19: Secondary | ICD-10-CM | POA: Insufficient documentation

## 2020-06-22 LAB — SARS CORONAVIRUS 2 (TAT 6-24 HRS): SARS Coronavirus 2: NEGATIVE

## 2020-06-24 NOTE — Anesthesia Preprocedure Evaluation (Addendum)
Anesthesia Evaluation  Patient identified by MRN, date of birth, ID band Patient awake    Reviewed: Allergy & Precautions, NPO status , Patient's Chart, lab work & pertinent test results  History of Anesthesia Complications Negative for: history of anesthetic complications  Airway Mallampati: II  TM Distance: >3 FB Neck ROM: Full    Dental no notable dental hx. (+) Dental Advisory Given   Pulmonary neg pulmonary ROS,    Pulmonary exam normal        Cardiovascular hypertension, Pt. on medications Normal cardiovascular exam     Neuro/Psych negative neurological ROS     GI/Hepatic Neg liver ROS, Hx of colon ca   Endo/Other  negative endocrine ROS  Renal/GU negative Renal ROS     Musculoskeletal negative musculoskeletal ROS (+)   Abdominal   Peds  Hematology negative hematology ROS (+)   Anesthesia Other Findings   Reproductive/Obstetrics                            Anesthesia Physical Anesthesia Plan  ASA: II  Anesthesia Plan: General   Post-op Pain Management:    Induction: Intravenous  PONV Risk Score and Plan: 4 or greater and Ondansetron, Dexamethasone, Diphenhydramine and Treatment may vary due to age or medical condition  Airway Management Planned: LMA  Additional Equipment:   Intra-op Plan:   Post-operative Plan: Extubation in OR  Informed Consent: I have reviewed the patients History and Physical, chart, labs and discussed the procedure including the risks, benefits and alternatives for the proposed anesthesia with the patient or authorized representative who has indicated his/her understanding and acceptance.     Dental advisory given  Plan Discussed with: CRNA and Anesthesiologist  Anesthesia Plan Comments:         Anesthesia Quick Evaluation

## 2020-06-25 ENCOUNTER — Encounter (HOSPITAL_BASED_OUTPATIENT_CLINIC_OR_DEPARTMENT_OTHER)
Admission: RE | Disposition: A | Discharge status: Home / Self Care | Payer: Self-pay | Source: Home / Self Care | Attending: Obstetrics and Gynecology

## 2020-06-25 ENCOUNTER — Encounter (HOSPITAL_BASED_OUTPATIENT_CLINIC_OR_DEPARTMENT_OTHER): Payer: Self-pay | Admitting: Obstetrics and Gynecology

## 2020-06-25 ENCOUNTER — Ambulatory Visit (HOSPITAL_BASED_OUTPATIENT_CLINIC_OR_DEPARTMENT_OTHER): Payer: Medicare Other | Admitting: Anesthesiology

## 2020-06-25 ENCOUNTER — Ambulatory Visit (HOSPITAL_BASED_OUTPATIENT_CLINIC_OR_DEPARTMENT_OTHER)
Admission: RE | Admit: 2020-06-25 | Discharge: 2020-06-25 | Disposition: A | Discharge status: Home / Self Care | Payer: Medicare Other | Attending: Obstetrics and Gynecology | Admitting: Obstetrics and Gynecology

## 2020-06-25 DIAGNOSIS — Z9049 Acquired absence of other specified parts of digestive tract: Secondary | ICD-10-CM | POA: Insufficient documentation

## 2020-06-25 DIAGNOSIS — D069 Carcinoma in situ of cervix, unspecified: Secondary | ICD-10-CM | POA: Insufficient documentation

## 2020-06-25 DIAGNOSIS — Z853 Personal history of malignant neoplasm of breast: Secondary | ICD-10-CM | POA: Insufficient documentation

## 2020-06-25 DIAGNOSIS — I10 Essential (primary) hypertension: Secondary | ICD-10-CM | POA: Diagnosis not present

## 2020-06-25 DIAGNOSIS — Z85038 Personal history of other malignant neoplasm of large intestine: Secondary | ICD-10-CM | POA: Diagnosis not present

## 2020-06-25 DIAGNOSIS — N8110 Cystocele, unspecified: Secondary | ICD-10-CM | POA: Insufficient documentation

## 2020-06-25 HISTORY — DX: Cystocele, unspecified: N81.10

## 2020-06-25 HISTORY — DX: Essential (primary) hypertension: I10

## 2020-06-25 HISTORY — PX: CYSTOCELE REPAIR: SHX163

## 2020-06-25 HISTORY — PX: CYSTOSCOPY: SHX5120

## 2020-06-25 HISTORY — DX: Presence of spectacles and contact lenses: Z97.3

## 2020-06-25 HISTORY — DX: Malignant neoplasm of colon, unspecified: C18.9

## 2020-06-25 HISTORY — PX: CERVICAL CONIZATION W/BX: SHX1330

## 2020-06-25 HISTORY — DX: Malignant neoplasm of unspecified site of unspecified female breast: C50.919

## 2020-06-25 LAB — POCT I-STAT, CHEM 8
BUN: 23 mg/dL (ref 8–23)
Calcium, Ion: 1.29 mmol/L (ref 1.15–1.40)
Chloride: 105 mmol/L (ref 98–111)
Creatinine, Ser: 0.9 mg/dL (ref 0.44–1.00)
Glucose, Bld: 97 mg/dL (ref 70–99)
HCT: 42 % (ref 36.0–46.0)
Hemoglobin: 14.3 g/dL (ref 12.0–15.0)
Potassium: 3.8 mmol/L (ref 3.5–5.1)
Sodium: 142 mmol/L (ref 135–145)
TCO2: 24 mmol/L (ref 22–32)

## 2020-06-25 LAB — TYPE AND SCREEN
ABO/RH(D): O POS
Antibody Screen: NEGATIVE

## 2020-06-25 LAB — ABO/RH: ABO/RH(D): O POS

## 2020-06-25 SURGERY — COLPORRHAPHY, ANTERIOR, FOR CYSTOCELE REPAIR
Anesthesia: General | Site: Vagina

## 2020-06-25 MED ORDER — CELECOXIB 200 MG PO CAPS
ORAL_CAPSULE | ORAL | Status: AC
  Filled 2020-06-25: qty 1

## 2020-06-25 MED ORDER — DEXAMETHASONE SODIUM PHOSPHATE 4 MG/ML IJ SOLN
INTRAMUSCULAR | Status: DC | PRN
  Administered 2020-06-25: 8 mg via INTRAVENOUS

## 2020-06-25 MED ORDER — OXYCODONE HCL 5 MG PO TABS: 5.0000 mg | ORAL_TABLET | Freq: Four times a day (QID) | ORAL | 0 refills | Status: DC | PRN

## 2020-06-25 MED ORDER — LIDOCAINE-EPINEPHRINE 1 %-1:100000 IJ SOLN
INTRAMUSCULAR | Status: DC | PRN
  Administered 2020-06-25: 10 mL

## 2020-06-25 MED ORDER — EPHEDRINE 5 MG/ML INJ
INTRAVENOUS | Status: AC
  Filled 2020-06-25: qty 10

## 2020-06-25 MED ORDER — PROPOFOL 10 MG/ML IV BOLUS
INTRAVENOUS | Status: AC
  Filled 2020-06-25: qty 40

## 2020-06-25 MED ORDER — ONDANSETRON HCL 4 MG/2ML IJ SOLN
INTRAMUSCULAR | Status: DC | PRN
  Administered 2020-06-25: 4 mg via INTRAVENOUS

## 2020-06-25 MED ORDER — IODINE STRONG (LUGOLS) 5 % PO SOLN
ORAL | Status: DC | PRN
  Administered 2020-06-25: 0.2 mL via ORAL

## 2020-06-25 MED ORDER — POVIDONE-IODINE 10 % EX SWAB: 2.0000 "application " | Freq: Once | CUTANEOUS | Status: DC

## 2020-06-25 MED ORDER — PROMETHAZINE HCL 25 MG/ML IJ SOLN: 6.2500 mg | INTRAMUSCULAR | Status: DC | PRN

## 2020-06-25 MED ORDER — CELECOXIB 200 MG PO CAPS
200.0000 mg | ORAL_CAPSULE | Freq: Once | ORAL | Status: AC
  Administered 2020-06-25: 200 mg via ORAL

## 2020-06-25 MED ORDER — DOCUSATE SODIUM 100 MG PO CAPS: 100.0000 mg | ORAL_CAPSULE | Freq: Two times a day (BID) | ORAL | 2 refills | Status: DC

## 2020-06-25 MED ORDER — FENTANYL CITRATE (PF) 100 MCG/2ML IJ SOLN: 25.0000 ug | INTRAMUSCULAR | Status: DC | PRN

## 2020-06-25 MED ORDER — SODIUM CHLORIDE 0.9 % IR SOLN
Status: DC | PRN
  Administered 2020-06-25: 1000 mL via INTRAVESICAL

## 2020-06-25 MED ORDER — CEFAZOLIN SODIUM-DEXTROSE 2-4 GM/100ML-% IV SOLN
INTRAVENOUS | Status: AC
  Filled 2020-06-25: qty 100

## 2020-06-25 MED ORDER — ACETAMINOPHEN 500 MG PO TABS
ORAL_TABLET | ORAL | Status: AC
  Filled 2020-06-25: qty 2

## 2020-06-25 MED ORDER — PROPOFOL 10 MG/ML IV BOLUS
INTRAVENOUS | Status: DC | PRN
  Administered 2020-06-25: 50 mg via INTRAVENOUS
  Administered 2020-06-25: 120 mg via INTRAVENOUS
  Administered 2020-06-25: 50 mg via INTRAVENOUS
  Administered 2020-06-25: 30 mg via INTRAVENOUS

## 2020-06-25 MED ORDER — FENTANYL CITRATE (PF) 100 MCG/2ML IJ SOLN
INTRAMUSCULAR | Status: AC
  Filled 2020-06-25: qty 2

## 2020-06-25 MED ORDER — VASOPRESSIN 20 UNIT/ML IV SOLN
INTRAVENOUS | Status: DC | PRN
  Administered 2020-06-25: 5 mL via INTRAMUSCULAR

## 2020-06-25 MED ORDER — ACETAMINOPHEN 500 MG PO TABS
1000.0000 mg | ORAL_TABLET | ORAL | Status: AC
  Administered 2020-06-25: 1000 mg via ORAL

## 2020-06-25 MED ORDER — EPHEDRINE SULFATE 50 MG/ML IJ SOLN
INTRAMUSCULAR | Status: DC | PRN
  Administered 2020-06-25 (×3): 10 mg via INTRAVENOUS

## 2020-06-25 MED ORDER — FENTANYL CITRATE (PF) 100 MCG/2ML IJ SOLN
INTRAMUSCULAR | Status: DC | PRN
  Administered 2020-06-25: 50 ug via INTRAVENOUS

## 2020-06-25 MED ORDER — LACTATED RINGERS IV SOLN: INTRAVENOUS | Status: DC

## 2020-06-25 MED ORDER — ACETAMINOPHEN 500 MG PO TABS: 1000.0000 mg | ORAL_TABLET | Freq: Once | ORAL | Status: DC

## 2020-06-25 MED ORDER — CEFAZOLIN SODIUM-DEXTROSE 2-4 GM/100ML-% IV SOLN
2.0000 g | INTRAVENOUS | Status: AC
  Administered 2020-06-25: 2 g via INTRAVENOUS

## 2020-06-25 SURGICAL SUPPLY — 39 items
BLADE EXTENDED COATED 6.5IN (ELECTRODE) IMPLANT
BLADE SURG 11 STRL SS (BLADE) ×5 IMPLANT
CANISTER SUCT 3000ML PPV (MISCELLANEOUS) IMPLANT
CATH ROBINSON RED A/P 16FR (CATHETERS) IMPLANT
CNTNR URN SCR LID CUP LEK RST (MISCELLANEOUS) ×1 IMPLANT
CONT SPEC 4OZ STRL OR WHT (MISCELLANEOUS) ×5
COVER WAND RF STERILE (DRAPES) ×5 IMPLANT
DECANTER SPIKE VIAL GLASS SM (MISCELLANEOUS) IMPLANT
DILATOR CANAL MILEX (MISCELLANEOUS) ×5 IMPLANT
ELECT BALL LEEP 3MM BLK (ELECTRODE) ×1 IMPLANT
GAUZE 4X4 16PLY RFD (DISPOSABLE) ×4 IMPLANT
GAUZE PACKING 1 X5 YD ST (GAUZE/BANDAGES/DRESSINGS) ×5 IMPLANT
GLOVE BIO SURGEON STRL SZ 6.5 (GLOVE) ×7 IMPLANT
GLOVE BIO SURGEONS STRL SZ 6.5 (GLOVE) ×2
GLOVE BIOGEL PI IND STRL 6.5 (GLOVE) ×3 IMPLANT
GLOVE BIOGEL PI IND STRL 7.0 (GLOVE) ×3 IMPLANT
GLOVE BIOGEL PI INDICATOR 6.5 (GLOVE) ×2
GLOVE BIOGEL PI INDICATOR 7.0 (GLOVE) ×2
GOWN STRL REUS W/TWL LRG LVL3 (GOWN DISPOSABLE) ×5 IMPLANT
HEMOSTAT SURGICEL 4X8 (HEMOSTASIS) ×5 IMPLANT
KIT TURNOVER CYSTO (KITS) ×5 IMPLANT
NDL SPNL 22GX3.5 QUINCKE BK (NEEDLE) ×1 IMPLANT
NEEDLE HYPO 22GX1.5 SAFETY (NEEDLE) ×5 IMPLANT
NEEDLE SPNL 22GX3.5 QUINCKE BK (NEEDLE) ×5 IMPLANT
NS IRRIG 500ML POUR BTL (IV SOLUTION) ×5 IMPLANT
PACK VAGINAL WOMENS (CUSTOM PROCEDURE TRAY) ×5 IMPLANT
SCOPETTES 8  STERILE (MISCELLANEOUS) ×5
SCOPETTES 8 STERILE (MISCELLANEOUS) ×3 IMPLANT
SET IRRIG Y TYPE TUR BLADDER L (SET/KITS/TRAYS/PACK) ×4 IMPLANT
SPONGE SURGIFOAM ABS GEL 12-7 (HEMOSTASIS) ×4 IMPLANT
SUT VIC AB 0 CT1 36 (SUTURE) ×5 IMPLANT
SUT VIC AB 2-0 UR6 27 (SUTURE) ×8 IMPLANT
SUT VIC AB 3-0 CT1 36 (SUTURE) ×8 IMPLANT
SUT VICRYL 0 UR6 27IN ABS (SUTURE) ×15 IMPLANT
SYR BULB IRRIG 60ML STRL (SYRINGE) ×5 IMPLANT
SYR CONTROL 10ML LL (SYRINGE) ×5 IMPLANT
TOWEL OR 17X26 10 PK STRL BLUE (TOWEL DISPOSABLE) ×10 IMPLANT
TRAY FOLEY W/BAG SLVR 14FR LF (SET/KITS/TRAYS/PACK) ×5 IMPLANT
VACUUM HOSE/TUBING 7/8INX6FT (MISCELLANEOUS) ×3 IMPLANT

## 2020-06-25 NOTE — Op Note (Addendum)
DATE OF PROCEDURE:  06/25/20  PREOPERATIVE DIAGNOSES: 1. CIN 3 2. Pelvic organ prolapse  POSTOPERATIVE DIAGNOSES: 1. Same  PROCEDURES PERFORMED: 1. Cold knife cone biopsy. 2. Anterior Colporrhaphy and cystoscopy   SURGEON: Lucillie Garfinkel, MD  ANESTHESIA: General.  SPECIMEN: Conization  PROCEDURE FINDINGS: Normal size uterus sounded to 7 cm.   DESCRIPTION OF PROCEDURE: The patient was taken to the operating room and placed in the supine position on the operating table, where general anesthesia was administered. She was then placed in the dorsal lithotomy position where examination under anesthesia was performed. The patient was prepped and draped in the usual manner for surgery.A foley catheter was inserted.   A weighted speculum was inserted into the vagina. The cervix was exposed with a retractor, and painted with lugol's. The anterior lip of the cervix was grasped with a single-toothed tenaculum. Angle sutures of 0 Vicryl were placed at the 3 o'clock and 9 o'clock positions of the cervix. Suture was also placed at 6 o'clock to mark the borders of the specimen. The cervix was infiltrated with diluted Pitressin solution. Sound placed in order to help guide excision. Cone biopsy was then excised with an 45 degree scalpel blade. After excision of the biopsy specimen, the biopsy bed was treated with cautery to assure hemostasis and then endocervical curettage was undertaken. Small piece of gelfoam inserted into cone bed and tied in place with angle sutures.   Anterior repair was then started. Vagina sterily swabbed internally with betadine. Dilute epi/lidocaine solution was infiltrated under the anterior vaginal mucosa midline. A small incision was made in the vaginal mucosa in the vagina and the Metzenbaum scissors were then used to dissect the mucosa off of the cystocele and cut the vaginal mucosa in the midline. The cut edges were held and splayed laterally with a series of Allis clamps. The  bladder was dissected away along the lateral edges with a combination of sharp and blunt dissection, exposing the vesicovaginal space. A series of #2-0 Vicryl interrupted sutures were then placed sequentially along the lateral folds of the vesicovaginal space and brought together to tuck the bladder back while simultaneously bringing the lateral vaginal tissues together. The excess vaginal mucosa was then trimmed. The vagina was then closed with interrupted first of eight #0 Vicryl sutures.   Foley catheter was removed. A cystoscope was inserted into the bladder. The bladder was intact and normal looking, with no evidence of trauma or perforation. Bilateral ureteric jets were visualized. The bladder was drained through the sheath of the cystoscope, and then the cystoscope was removed.  All instruments were removed. The patient was returned to the supine position. The patient was awakened from anesthesia without any difficulty and transferred to the recovery room in good condition. The patient tolerated the procedure well.   Arty Baumgartner MD

## 2020-06-25 NOTE — Progress Notes (Signed)
No updates to above H&P. Patient arrived NPO and was consented in PACU. Risks again discussed, all questions answered, and consent signed. Proceed with below surgery.    Lucillie Garfinkel MD

## 2020-06-25 NOTE — Anesthesia Procedure Notes (Signed)
Procedure Name: LMA Insertion Date/Time: 06/25/2020 7:33 AM Performed by: Georgeanne Nim, CRNA Pre-anesthesia Checklist: Patient identified, Emergency Drugs available, Suction available, Patient being monitored and Timeout performed Patient Re-evaluated:Patient Re-evaluated prior to induction Oxygen Delivery Method: Circle system utilized Preoxygenation: Pre-oxygenation with 100% oxygen Induction Type: IV induction Ventilation: Mask ventilation without difficulty LMA: LMA inserted LMA Size: 4.0 Number of attempts: 1 Placement Confirmation: positive ETCO2,  CO2 detector and breath sounds checked- equal and bilateral Tube secured with: Tape Dental Injury: Teeth and Oropharynx as per pre-operative assessment

## 2020-06-25 NOTE — Discharge Instructions (Signed)

## 2020-06-25 NOTE — Anesthesia Postprocedure Evaluation (Signed)
Anesthesia Post Note  Patient: Kathy Carpenter  Procedure(s) Performed: ANTERIOR REPAIR (CYSTOCELE) (N/A Vagina ) CONIZATION CERVIX WITH BIOPSY (cold knife cone) (N/A Cervix) CYSTOSCOPY (Bladder)     Patient location during evaluation: PACU Anesthesia Type: General Level of consciousness: sedated Pain management: pain level controlled Vital Signs Assessment: post-procedure vital signs reviewed and stable Respiratory status: spontaneous breathing and respiratory function stable Cardiovascular status: stable Postop Assessment: no apparent nausea or vomiting Anesthetic complications: no   No complications documented.  Last Vitals:  Vitals:   06/25/20 0930 06/25/20 1015  BP: 117/64 125/70  Pulse: 73 80  Resp: 17 16  Temp:  (!) 36.3 C  SpO2: (!) 88% 98%    Last Pain:  Vitals:   06/25/20 1015  TempSrc:   PainSc: 0-No pain                 [name] DANIEL

## 2020-06-25 NOTE — Transfer of Care (Signed)
Immediate Anesthesia Transfer of Care Note  Patient: Kathy Carpenter  Procedure(s) Performed: ANTERIOR REPAIR (CYSTOCELE) (N/A Vagina ) CONIZATION CERVIX WITH BIOPSY (cold knife cone) (N/A Cervix) CYSTOSCOPY (Bladder)  Patient Location: PACU  Anesthesia Type:General  Level of Consciousness: awake, alert , oriented and patient cooperative  Airway & Oxygen Therapy: Patient Spontanous Breathing and Patient connected to nasal cannula oxygen  Post-op Assessment: Report given to RN and Post -op Vital signs reviewed and stable  Post vital signs: Reviewed and stable  Last Vitals:  Vitals Value Taken Time  BP 122/66 06/25/20 0846  Temp    Pulse 91 06/25/20 0849  Resp 16 06/25/20 0849  SpO2 91 % 06/25/20 0849  Vitals shown include unvalidated device data.  Last Pain:  Vitals:   06/25/20 0600  TempSrc: Oral  PainSc: 0-No pain      Patients Stated Pain Goal: 4 (16/83/72 9021)  Complications: No complications documented.

## 2020-06-27 ENCOUNTER — Encounter (HOSPITAL_BASED_OUTPATIENT_CLINIC_OR_DEPARTMENT_OTHER): Payer: Self-pay | Admitting: Obstetrics and Gynecology

## 2020-06-27 LAB — SURGICAL PATHOLOGY

## 2020-07-13 ENCOUNTER — Telehealth: Payer: Self-pay | Admitting: *Deleted

## 2020-07-13 NOTE — Telephone Encounter (Signed)
Called and left the patient a message to call the office back. The patient needs to scheduled for a new patient appt .

## 2020-07-16 NOTE — Telephone Encounter (Signed)
Called and left the patient a message to call the office back. Patient needs to be schedule for a new patient appt.

## 2020-07-16 NOTE — Telephone Encounter (Signed)
Patient called back and scheduled a new patient appt. Patient aware of the location and was giving the phone number for the clinic. Patient given the policy for mask and visitors

## 2020-07-24 ENCOUNTER — Telehealth: Payer: Self-pay | Admitting: *Deleted

## 2020-07-24 NOTE — Telephone Encounter (Signed)
Patient called and canceled her appt for tomorrow. Patient stated "I will be going out of town for a little vacation. I will back the second week in November. I will call back then to reschedule an appt." Message sent to Dr Royston Sinner of the cancel appt

## 2020-07-25 ENCOUNTER — Ambulatory Visit: Payer: Medicare Other | Admitting: Gynecologic Oncology

## 2020-11-14 DIAGNOSIS — H59033 Cystoid macular edema following cataract surgery, bilateral: Secondary | ICD-10-CM | POA: Diagnosis not present

## 2020-12-12 DIAGNOSIS — L821 Other seborrheic keratosis: Secondary | ICD-10-CM | POA: Diagnosis not present

## 2020-12-12 DIAGNOSIS — D0462 Carcinoma in situ of skin of left upper limb, including shoulder: Secondary | ICD-10-CM | POA: Diagnosis not present

## 2020-12-12 DIAGNOSIS — Z85828 Personal history of other malignant neoplasm of skin: Secondary | ICD-10-CM | POA: Diagnosis not present

## 2021-03-26 DIAGNOSIS — R609 Edema, unspecified: Secondary | ICD-10-CM | POA: Diagnosis not present

## 2021-03-26 DIAGNOSIS — Z23 Encounter for immunization: Secondary | ICD-10-CM | POA: Diagnosis not present

## 2021-03-26 DIAGNOSIS — Z85038 Personal history of other malignant neoplasm of large intestine: Secondary | ICD-10-CM | POA: Diagnosis not present

## 2021-03-26 DIAGNOSIS — E7800 Pure hypercholesterolemia, unspecified: Secondary | ICD-10-CM | POA: Diagnosis not present

## 2021-03-26 DIAGNOSIS — I10 Essential (primary) hypertension: Secondary | ICD-10-CM | POA: Diagnosis not present

## 2021-03-26 DIAGNOSIS — N183 Chronic kidney disease, stage 3 unspecified: Secondary | ICD-10-CM | POA: Diagnosis not present

## 2021-03-26 DIAGNOSIS — R7309 Other abnormal glucose: Secondary | ICD-10-CM | POA: Diagnosis not present

## 2021-03-26 DIAGNOSIS — Z0000 Encounter for general adult medical examination without abnormal findings: Secondary | ICD-10-CM | POA: Diagnosis not present

## 2021-03-26 DIAGNOSIS — Z853 Personal history of malignant neoplasm of breast: Secondary | ICD-10-CM | POA: Diagnosis not present

## 2021-04-30 DIAGNOSIS — I10 Essential (primary) hypertension: Secondary | ICD-10-CM | POA: Diagnosis not present

## 2021-05-20 DIAGNOSIS — Z85828 Personal history of other malignant neoplasm of skin: Secondary | ICD-10-CM | POA: Diagnosis not present

## 2021-05-20 DIAGNOSIS — L821 Other seborrheic keratosis: Secondary | ICD-10-CM | POA: Diagnosis not present

## 2021-05-29 DIAGNOSIS — Z779 Other contact with and (suspected) exposures hazardous to health: Secondary | ICD-10-CM | POA: Diagnosis not present

## 2021-05-29 DIAGNOSIS — N8110 Cystocele, unspecified: Secondary | ICD-10-CM | POA: Diagnosis not present

## 2021-12-18 DIAGNOSIS — H04123 Dry eye syndrome of bilateral lacrimal glands: Secondary | ICD-10-CM | POA: Diagnosis not present

## 2021-12-18 DIAGNOSIS — H35373 Puckering of macula, bilateral: Secondary | ICD-10-CM | POA: Diagnosis not present

## 2021-12-18 DIAGNOSIS — Z961 Presence of intraocular lens: Secondary | ICD-10-CM | POA: Diagnosis not present

## 2021-12-18 DIAGNOSIS — H5213 Myopia, bilateral: Secondary | ICD-10-CM | POA: Diagnosis not present

## 2022-02-11 DIAGNOSIS — I10 Essential (primary) hypertension: Secondary | ICD-10-CM | POA: Diagnosis not present

## 2022-06-25 DIAGNOSIS — L820 Inflamed seborrheic keratosis: Secondary | ICD-10-CM | POA: Diagnosis not present

## 2022-06-30 DIAGNOSIS — E7800 Pure hypercholesterolemia, unspecified: Secondary | ICD-10-CM | POA: Diagnosis not present

## 2022-06-30 DIAGNOSIS — Z0000 Encounter for general adult medical examination without abnormal findings: Secondary | ICD-10-CM | POA: Diagnosis not present

## 2022-06-30 DIAGNOSIS — Z23 Encounter for immunization: Secondary | ICD-10-CM | POA: Diagnosis not present

## 2022-06-30 DIAGNOSIS — I10 Essential (primary) hypertension: Secondary | ICD-10-CM | POA: Diagnosis not present

## 2022-06-30 LAB — LAB REPORT - SCANNED: EGFR: 52

## 2022-09-01 DIAGNOSIS — C44329 Squamous cell carcinoma of skin of other parts of face: Secondary | ICD-10-CM | POA: Diagnosis not present

## 2022-11-24 DIAGNOSIS — C44319 Basal cell carcinoma of skin of other parts of face: Secondary | ICD-10-CM | POA: Diagnosis not present

## 2022-12-24 DIAGNOSIS — Z1231 Encounter for screening mammogram for malignant neoplasm of breast: Secondary | ICD-10-CM | POA: Diagnosis not present

## 2023-01-22 ENCOUNTER — Ambulatory Visit
Admission: RE | Admit: 2023-01-22 | Discharge: 2023-01-22 | Disposition: A | Discharge status: Home / Self Care | Payer: Medicare Other | Source: Ambulatory Visit | Attending: Family Medicine | Admitting: Family Medicine

## 2023-01-22 VITALS — BP 150/76 | HR 68 | Temp 98.5°F | Resp 16

## 2023-01-22 DIAGNOSIS — H6123 Impacted cerumen, bilateral: Secondary | ICD-10-CM

## 2023-01-22 NOTE — ED Triage Notes (Signed)
Patient states she went to have a hearing test on Tuesday and they told her she had too much wax in her ears to have the test done.

## 2023-01-22 NOTE — Discharge Instructions (Signed)
Please purchase Debrox (carbamide peroxide) and use those drops as directed on the box, especially in your right ear.  Then you can follow-up here or with your primary care about the earwax.  If we cannot get it out we may need to have you follow-up with ENT

## 2023-01-22 NOTE — ED Provider Notes (Signed)
[facility]    CSN: [id number] Arrival date & time: 01/22/23  1355      History   Chief Complaint Chief Complaint  Patient presents with   Ear Fullness    HPI Kathy Carpenter is a 88 y.o. female.    Ear Fullness   Here for cerumen impaction.  She was seen at audiology recently and was told that they could not do her hearing test until she had her ears cleaned out due to cerumen in both ears.   Past Medical History:  Diagnosis Date   Breast cancer    Cervical intraepithelial neoplasia grade 3 2021   Colon cancer    Female cystocele    High grade squamous intraepithelial lesion (HGSIL), grade 3 CIN, on biopsy of cervix 06/2020   History of colon cancer 2006   s/p  sigmoid colectomy for large cancerous polyp 12-18-2004;  per pt no chemo or radiation and no recurrence   History of left breast cancer 2004   dx DCIS high grade;  s/p  left breast lumpectomy w/ node dissection 06-05-2003 and completed chemo/ radiation 2004;  per pt no recurrence and released by oncologist approx. 2014   HTN (hypertension)    followed by pcp   Wears glasses     There are no problems to display for this patient.   Past Surgical History:  Procedure Laterality Date   BREAST LUMPECTOMY WITH NEEDLE LOCALIZATION AND AXILLARY LYMPH NODE DISSECTION Left 06-05-2003      CATARACT EXTRACTION W/ INTRAOCULAR LENS  IMPLANT, BILATERAL  yrs ago   CERVICAL CONIZATION W/BX N/A 06/25/2020   Procedure: CONIZATION CERVIX WITH BIOPSY (cold knife cone);  Surgeon: Ranae Pila, MD;  Location: [facility];  Service: Gynecology;  Laterality: N/A;   COLONOSCOPY  last one approx. 2015   CYSTOCELE REPAIR N/A 06/25/2020   Procedure: ANTERIOR REPAIR (CYSTOCELE);  Surgeon: Ranae Pila, MD;  Location: [facility];  Service: Gynecology;  Laterality: N/A;   CYSTOSCOPY  06/25/2020   Procedure: CYSTOSCOPY;  Surgeon: Ranae Pila, MD;  Location:  [facility];  Service: Gynecology;;   LAPAROSCOPIC BILATERAL SALPINGO OOPHERECTOMY  10-08-2005      LAPAROSCOPIC SIGMOID COLECTOMY  12-18-2004      PARTIAL COLECTOMY      OB History   No obstetric history on file.      Home Medications    Prior to Admission medications   Medication Sig Start Date End Date Taking? Authorizing Provider  amLODipine (NORVASC) 5 MG tablet Take 7.5 mg by mouth at bedtime.    [provider]  Ascorbic Acid (VITAMIN C PO) Take by mouth at bedtime.    [provider]  Carboxymethylcellul-Glycerin ([facility] LUBRICATING/DRY EYE OP) Apply to eye as needed.    [provider]  docusate sodium (COLACE) 100 MG capsule Take 1 capsule (100 mg total) by mouth 2 (two) times daily. 06/25/20   Ranae Pila, MD  lisinopril (ZESTRIL) 40 MG tablet Take 1 tablet by mouth daily in the afternoon. 02/01/20   [provider]    Family History No family history on file.  Social History Social History   Tobacco Use   Smoking status: Never   Smokeless tobacco: Never  Substance Use Topics   Alcohol use: Yes    Comment: occasional   Drug use: Never     Allergies   Patient has no known allergies.   Review of Systems Review of Systems  Physical Exam Triage Vital Signs ED Triage Vitals  Enc Vitals Group     BP 01/22/23 1451 (!) 150/76     Pulse Rate 01/22/23 1451 68     Resp 01/22/23 1451 16     Temp 01/22/23 1451 98.5 F (36.9 C)     Temp Source 01/22/23 1451 Oral     SpO2 --      Weight --      Height --      Head Circumference --      Peak Flow --      Pain Score 01/22/23 1452 0     Pain Loc --      Pain Edu? --      Excl. in GC? --    No data found.  Updated Vital Signs BP (!) 150/76 (BP Location: Left Arm)   Pulse 68   Temp 98.5 F (36.9 C) (Oral)   Resp 16   Visual Acuity Right Eye Distance:   Left Eye Distance:   Bilateral Distance:    Right Eye Near:   Left Eye Near:     Bilateral Near:     Physical Exam Vitals reviewed.  Constitutional:      General: She is not in acute distress.    Appearance: She is not ill-appearing, toxic-appearing or diaphoretic.  HENT:     Ears:     Comments: Bilateral tympanic membranes are obscured by cerumen Neurological:     Mental Status: She is alert and oriented to person, place, and time.  Psychiatric:        Behavior: Behavior normal.      UC Treatments / Results  Labs (all labs ordered are listed, but only abnormal results are displayed) Labs Reviewed - No data to display  EKG   Radiology No results found.  Procedures Procedures (including critical care time)  Medications Ordered in UC Medications - No data to display  Initial Impression / Assessment and Plan / UC Course  I have reviewed the triage vital signs and the nursing notes.  Pertinent labs & imaging results that were available during my care of the patient were reviewed by me and considered in my medical decision making (see chart for details).        After lots of effort by the nursing staff and positive lavage, the left ear was cleared out, but the cerumen in the right ear we could not get out.  On reexamination the left ear was clear, but the right ear still had some cerumen deep in the ear canal.  Will ask her to get Debrox and to either follow-up here or with her primary care about this issue Final Clinical Impressions(s) / UC Diagnoses   Final diagnoses:  Bilateral impacted cerumen     Discharge Instructions      Please purchase Debrox (carbamide peroxide) and use those drops as directed on the box, especially in your right ear.  Then you can follow-up here or with your primary care about the earwax.  If we cannot get it out we may need to have you follow-up with ENT     ED Prescriptions   None    PDMP not reviewed this encounter.   Zenia Resides, MD 01/22/23 [phone]

## 2023-02-23 DIAGNOSIS — H903 Sensorineural hearing loss, bilateral: Secondary | ICD-10-CM | POA: Diagnosis not present

## 2023-02-23 DIAGNOSIS — H6121 Impacted cerumen, right ear: Secondary | ICD-10-CM | POA: Diagnosis not present

## 2023-03-17 ENCOUNTER — Ambulatory Visit (INDEPENDENT_AMBULATORY_CARE_PROVIDER_SITE_OTHER): Payer: Medicare Other | Admitting: Family Medicine

## 2023-03-17 ENCOUNTER — Encounter: Payer: Self-pay | Admitting: Family Medicine

## 2023-03-17 VITALS — BP 110/60 | HR 68 | Temp 98.0°F | Ht 64.5 in | Wt 150.6 lb

## 2023-03-17 DIAGNOSIS — I10 Essential (primary) hypertension: Secondary | ICD-10-CM | POA: Diagnosis not present

## 2023-03-17 DIAGNOSIS — G4700 Insomnia, unspecified: Secondary | ICD-10-CM

## 2023-03-17 DIAGNOSIS — E785 Hyperlipidemia, unspecified: Secondary | ICD-10-CM | POA: Diagnosis not present

## 2023-03-17 DIAGNOSIS — F324 Major depressive disorder, single episode, in partial remission: Secondary | ICD-10-CM | POA: Diagnosis not present

## 2023-03-17 DIAGNOSIS — M159 Polyosteoarthritis, unspecified: Secondary | ICD-10-CM

## 2023-03-17 DIAGNOSIS — M1990 Unspecified osteoarthritis, unspecified site: Secondary | ICD-10-CM | POA: Insufficient documentation

## 2023-03-17 MED ORDER — LISINOPRIL 40 MG PO TABS: 40.0000 mg | ORAL_TABLET | Freq: Every day | ORAL | 3 refills | Status: DC

## 2023-03-17 MED ORDER — AMLODIPINE BESYLATE 5 MG PO TABS: 7.5000 mg | ORAL_TABLET | Freq: Every day | ORAL | 3 refills | Status: DC

## 2023-03-17 MED ORDER — CITALOPRAM HYDROBROMIDE 10 MG PO TABS: 10.0000 mg | ORAL_TABLET | Freq: Every day | ORAL | 3 refills | Status: DC

## 2023-03-17 MED ORDER — TRAZODONE HCL 50 MG PO TABS: 50.0000 mg | ORAL_TABLET | Freq: Every evening | ORAL | 3 refills | Status: DC | PRN
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
## 2023-04-21 DIAGNOSIS — R300 Dysuria: Secondary | ICD-10-CM | POA: Diagnosis not present

## 2023-06-29 ENCOUNTER — Encounter: Payer: Self-pay | Admitting: Family Medicine

## 2023-08-04 ENCOUNTER — Ambulatory Visit (INDEPENDENT_AMBULATORY_CARE_PROVIDER_SITE_OTHER): Payer: Medicare Other | Admitting: Family Medicine

## 2023-08-04 ENCOUNTER — Encounter: Payer: Self-pay | Admitting: Family Medicine

## 2023-08-04 VITALS — BP 108/58 | HR 62 | Temp 98.2°F | Ht 64.5 in | Wt 151.0 lb

## 2023-08-04 DIAGNOSIS — R739 Hyperglycemia, unspecified: Secondary | ICD-10-CM

## 2023-08-04 DIAGNOSIS — I10 Essential (primary) hypertension: Secondary | ICD-10-CM | POA: Diagnosis not present

## 2023-08-04 DIAGNOSIS — F324 Major depressive disorder, single episode, in partial remission: Secondary | ICD-10-CM | POA: Diagnosis not present

## 2023-08-04 DIAGNOSIS — M150 Primary generalized (osteo)arthritis: Secondary | ICD-10-CM | POA: Diagnosis not present

## 2023-08-04 DIAGNOSIS — Z23 Encounter for immunization: Secondary | ICD-10-CM | POA: Diagnosis not present

## 2023-08-04 DIAGNOSIS — G4700 Insomnia, unspecified: Secondary | ICD-10-CM | POA: Diagnosis not present

## 2023-08-04 DIAGNOSIS — E785 Hyperlipidemia, unspecified: Secondary | ICD-10-CM

## 2023-08-04 LAB — CBC WITH DIFFERENTIAL/PLATELET
Basophils Absolute: 0.1 10*3/uL (ref 0.0–0.1)
Basophils Relative: 1 % (ref 0.0–3.0)
Eosinophils Absolute: 0.2 10*3/uL (ref 0.0–0.7)
Eosinophils Relative: 2.3 % (ref 0.0–5.0)
HCT: 44.1 % (ref 36.0–46.0)
Hemoglobin: 14.3 g/dL (ref 12.0–15.0)
Lymphocytes Relative: 32.2 % (ref 12.0–46.0)
Lymphs Abs: 2.4 10*3/uL (ref 0.7–4.0)
MCHC: 32.4 g/dL (ref 30.0–36.0)
MCV: 87.2 fL (ref 78.0–100.0)
Monocytes Absolute: 0.7 10*3/uL (ref 0.1–1.0)
Monocytes Relative: 9.2 % (ref 3.0–12.0)
Neutro Abs: 4.2 10*3/uL (ref 1.4–7.7)
Neutrophils Relative %: 55.3 % (ref 43.0–77.0)
Platelets: 338 10*3/uL (ref 150.0–400.0)
RBC: 5.06 Mil/uL (ref 3.87–5.11)
RDW: 13.7 % (ref 11.5–15.5)
WBC: 7.6 10*3/uL (ref 4.0–10.5)

## 2023-08-04 LAB — BASIC METABOLIC PANEL
BUN: 26 mg/dL — ABNORMAL HIGH (ref 6–23)
CO2: 27 meq/L (ref 19–32)
Calcium: 9.7 mg/dL (ref 8.4–10.5)
Chloride: 105 meq/L (ref 96–112)
Creatinine, Ser: 0.99 mg/dL (ref 0.40–1.20)
GFR: 51.84 mL/min — ABNORMAL LOW (ref >60.00)
Glucose, Bld: 95 mg/dL (ref 70–99)
Potassium: 4.1 meq/L (ref 3.5–5.1)
Sodium: 139 meq/L (ref 135–145)

## 2023-08-04 LAB — LIPID PANEL
Cholesterol: 163 mg/dL (ref 0–200)
HDL: 51.1 mg/dL (ref >39.00)
LDL Cholesterol: 101 mg/dL — ABNORMAL HIGH (ref 0–99)
NonHDL: 111.87
Total CHOL/HDL Ratio: 3
Triglycerides: 52 mg/dL (ref 0.0–149.0)
VLDL: 10.4 mg/dL (ref 0.0–40.0)

## 2023-08-04 LAB — HEPATIC FUNCTION PANEL
ALT: 14 U/L (ref 0–35)
AST: 19 U/L (ref 0–37)
Albumin: 4.2 g/dL (ref 3.5–5.2)
Alkaline Phosphatase: 95 U/L (ref 39–117)
Bilirubin, Direct: 0.1 mg/dL (ref 0.0–0.3)
Total Bilirubin: 0.4 mg/dL (ref 0.2–1.2)
Total Protein: 7.2 g/dL (ref 6.0–8.3)

## 2023-08-04 LAB — TSH: TSH: 2.23 u[IU]/mL (ref 0.35–5.50)

## 2023-08-04 LAB — HEMOGLOBIN A1C: Hgb A1c MFr Bld: 5.5 % (ref 4.6–6.5)

## 2023-08-04 MED ORDER — CITALOPRAM HYDROBROMIDE 20 MG PO TABS: 20.0000 mg | ORAL_TABLET | Freq: Every day | ORAL | 3 refills | Status: DC

## 2023-08-04 MED ORDER — TRAZODONE HCL 50 MG PO TABS: 50.0000 mg | ORAL_TABLET | Freq: Every day | ORAL | 3 refills | Status: DC
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
## 2023-08-04 NOTE — Addendum Note (Signed)
Addended by: Carola Rhine on: 08/04/2023 09:54 AM   Modules accepted: Orders

## 2023-09-17 ENCOUNTER — Encounter: Payer: Self-pay | Admitting: Internal Medicine

## 2023-09-17 ENCOUNTER — Telehealth: Payer: Medicare Other | Admitting: Internal Medicine

## 2023-09-17 VITALS — Wt 147.0 lb

## 2023-09-17 DIAGNOSIS — J069 Acute upper respiratory infection, unspecified: Secondary | ICD-10-CM | POA: Diagnosis not present

## 2023-09-17 MED ORDER — BENZONATATE 100 MG PO CAPS: 100.0000 mg | ORAL_CAPSULE | Freq: Two times a day (BID) | ORAL | 0 refills | Status: DC | PRN

## 2023-09-17 NOTE — Progress Notes (Signed)
Virtual Visit via Video Note  I connected with Kathy Carpenter on 09/17/23 at  3:30 PM EST by a video enabled telemedicine application and verified that I am speaking with the correct person using two identifiers.  Location patient: home Location provider: work office Persons participating in the virtual visit: patient, provider  I discussed the limitations of evaluation and management by telemedicine and the availability of in person appointments. The patient expressed understanding and agreed to proceed.   HPI: For the past 5 days has been having congestion, runny nose, sore throat and headache.  Now she has also developed a cough that is making it difficult to sleep.  She has been using NyQuil and DayQuil with some relief.  She has no fever, no shortness of breath.  She has taken 2 COVID tests at home that have been negative.   ROS: Negative unless indicated in HPI.  Past Medical History:  Diagnosis Date   Breast cancer (HCC)    Cervical intraepithelial neoplasia grade 3 2021   Colon cancer (HCC)    Depression    Dyslipidemia    Female cystocele    High grade squamous intraepithelial lesion (HGSIL), grade 3 CIN, on biopsy of cervix 06/2020   History of colon cancer 2006   s/p  sigmoid colectomy for large cancerous polyp 12-18-2004;  per pt no chemo or radiation and no recurrence   History of left breast cancer 2004   dx DCIS high grade;  s/p  left breast lumpectomy w/ node dissection 06-05-2003 and completed chemo/ radiation 2004;  per pt no recurrence and released by oncologist approx. 2014   HTN (hypertension)    followed by pcp   Wears glasses     Past Surgical History:  Procedure Laterality Date   BREAST LUMPECTOMY WITH NEEDLE LOCALIZATION AND AXILLARY LYMPH NODE DISSECTION Left 06-05-2003   @[facility]    BREAST SURGERY     CATARACT EXTRACTION W/ INTRAOCULAR LENS  IMPLANT, BILATERAL  yrs ago   CERVICAL CONIZATION W/BX N/A 06/25/2020   Procedure: CONIZATION  CERVIX WITH BIOPSY (cold knife cone);  Surgeon: Ranae Pila, MD;  Location: [facility];  Service: Gynecology;  Laterality: N/A;   COLON SURGERY     COLONOSCOPY  last one approx. 2015   CYSTOCELE REPAIR N/A 06/25/2020   Procedure: ANTERIOR REPAIR (CYSTOCELE);  Surgeon: Ranae Pila, MD;  Location: [facility];  Service: Gynecology;  Laterality: N/A;   CYSTOSCOPY  06/25/2020   Procedure: CYSTOSCOPY;  Surgeon: Ranae Pila, MD;  Location: [facility];  Service: Gynecology;;   LAPAROSCOPIC BILATERAL SALPINGO OOPHERECTOMY  10-08-2005   @[facility]    LAPAROSCOPIC SIGMOID COLECTOMY  12-18-2004   @[facility]    PARTIAL COLECTOMY      History reviewed. No pertinent family history.  SOCIAL HX:   reports that she has never smoked. She has never used smokeless tobacco. She reports current alcohol use. She reports that she does not use drugs.   Current Outpatient Medications:    amLODipine (NORVASC) 5 MG tablet, Take 1.5 tablets (7.5 mg total) by mouth at bedtime., Disp: 135 tablet, Rfl: 3   Ascorbic Acid (VITAMIN C PO), Take by mouth at bedtime., Disp: , Rfl:    benzonatate (TESSALON) 100 MG capsule, Take 1 capsule (100 mg total) by mouth 2 (two) times daily as needed for cough., Disp: 20 capsule, Rfl: 0   citalopram (CELEXA) 20 MG tablet, Take 1 tablet (20 mg total) by mouth daily., Disp:  90 tablet, Rfl: 3   lisinopril (ZESTRIL) 40 MG tablet, Take 1 tablet (40 mg total) by mouth daily in the afternoon., Disp: 90 tablet, Rfl: 3   Multiple Vitamin (MULTIVITAMIN ADULT PO), Take by mouth., Disp: , Rfl:    traZODone (DESYREL) 50 MG tablet, Take 1 tablet (50 mg total) by mouth at bedtime., Disp: 90 tablet, Rfl: 3  EXAM:   VITALS per patient if applicable: None reported  GENERAL: alert, oriented, appears well and in no acute distress, sounds congested  HEENT: atraumatic, conjunttiva clear, no obvious abnormalities on inspection of external nose  and ears  NECK: normal movements of the head and neck  LUNGS: on inspection no signs of respiratory distress, breathing rate appears normal, no obvious gross increased work of breathing, gasping or wheezing  CV: no obvious cyanosis  MS: moves all visible extremities without noticeable abnormality  PSYCH/NEURO: pleasant and cooperative, no obvious depression or anxiety, speech and thought processing grossly intact  ASSESSMENT AND PLAN:   URI with cough and congestion - Plan: benzonatate (TESSALON) 100 MG capsule  -Given exam findings, PNA, pharyngitis, ear infection are not likely, hence abx have not been prescribed. -Have advised rest, fluids, OTC antihistamines, cough suppressants and mucinex. -RTC if no improvement in 10-14 days.    I discussed the assessment and treatment plan with the patient. The patient was provided an opportunity to ask questions and all were answered. The patient agreed with the plan and demonstrated an understanding of the instructions.   The patient was advised to call back or seek an in-person evaluation if the symptoms worsen or if the condition fails to improve as anticipated.    Kathy Jan, MD  [facility]

## 2023-12-28 ENCOUNTER — Encounter: Payer: Self-pay | Admitting: Family Medicine

## 2023-12-28 ENCOUNTER — Telehealth: Payer: Self-pay

## 2023-12-28 NOTE — Telephone Encounter (Signed)
 Copied from CRM [id number]. Topic: Clinical - Medical Advice >> Dec 28, 2023 10:54 AM Josefa Half C wrote: Reason for CRM: Patient has UTI symptoms and has asked for the provider to prescribe her medication for it; please follow up with patient [phone] Preferred pharmacy: [id number] [place], [place] - [address] AT [facility] OF [address] & [facility] [address], [place] [address] Phone: [phone]  Fax: [phone]

## 2023-12-28 NOTE — Telephone Encounter (Signed)
 Copied from CRM [id number]. Topic: Clinical - Medical Advice >> Dec 28, 2023 10:54 AM Josefa Half C wrote: Reason for CRM: Patient has UTI symptoms and has asked for the provider to prescribe her medication for it; please follow up with patient [phone] Preferred pharmacy: [facility] #[id number] [place], [place] - [address] AT [facility] OF [facility] [address] & [facility] [address] [place] [address] Phone: [phone]  Fax: [phone] >> Dec 28, 2023  2:26 PM Truddie Crumble wrote: Patient daughter called wanting an update on the patient previous message that was sent regarding the patient UTI and medication

## 2023-12-29 NOTE — Telephone Encounter (Signed)
 Spoke with pt advised to schedule appointment with another provider since PCP of not available declined state that she will go to [facility] if symptoms gets worse

## 2023-12-29 NOTE — Telephone Encounter (Signed)
 Duplicate- already resolved

## 2023-12-29 NOTE — Telephone Encounter (Signed)
 Spoke with pt offered pt appointment with another provider at the office since Dr Clent Ridges is out of the office, pt declined stated that she was feeling better but if symptoms get worse she will go to [facility]

## 2024-03-14 DIAGNOSIS — H52203 Unspecified astigmatism, bilateral: Secondary | ICD-10-CM | POA: Diagnosis not present

## 2024-03-14 DIAGNOSIS — Z961 Presence of intraocular lens: Secondary | ICD-10-CM | POA: Diagnosis not present

## 2024-03-14 DIAGNOSIS — H524 Presbyopia: Secondary | ICD-10-CM | POA: Diagnosis not present

## 2024-03-14 DIAGNOSIS — H35371 Puckering of macula, right eye: Secondary | ICD-10-CM | POA: Diagnosis not present

## 2024-03-14 DIAGNOSIS — H04123 Dry eye syndrome of bilateral lacrimal glands: Secondary | ICD-10-CM | POA: Diagnosis not present

## 2024-03-14 DIAGNOSIS — H5213 Myopia, bilateral: Secondary | ICD-10-CM | POA: Diagnosis not present

## 2024-04-26 ENCOUNTER — Other Ambulatory Visit: Payer: Self-pay | Admitting: Family Medicine

## 2024-04-29 ENCOUNTER — Other Ambulatory Visit: Payer: Self-pay | Admitting: Family Medicine

## 2024-06-09 ENCOUNTER — Ambulatory Visit: Admitting: Family Medicine

## 2024-06-09 NOTE — Progress Notes (Deleted)
 PATIENT CHECK-IN and HEALTH RISK ASSESSMENT QUESTIONNAIRE:  -completed by phone/video for upcoming Medicare Preventive Visit  -PLEASE SELECT NOT IN PERSON for the method of visit.   FIRST check to see if the patient completed the online questionnaire - if so, this can be found under the rooming tab, then go to the questionnaires tab. Some of the questions are the same and you can use the answers to complete all of the bold questions below before calling the patient. Question #s below: 6 (fall risk screening), under habits # 1, 2, 3, 8 and 9,  under everyday activities #2, 3 and 8.   Pre-Visit Check-in: 1)Vitals (height, wt, BP, etc) - record in vitals section for visit on day of visit Request home vitals (wt, BP, etc.) and enter into vitals, THEN update Vital Signs SmartPhrase below at the top of the HPI. See below.  2)Review and Update Medications, Allergies PMH, Surgeries, Social history in Epic 3)Hospitalizations in the last year with date/reason? ***  4)Review and Update Care Team (patient's specialists) in Epic 5) Complete PHQ9 in Epic  6) Complete Fall Screening in Epic 7)Review all Health Maintenance Due and order if not done.  Medicare Wellness Patient Questionnaire:  Answer theses question about your habits: How often do you have a drink containing alcohol?*** How many drinks containing alcohol do you have on a typical day when you are drinking?*** How often do you have six or more drinks on one occasion?*** Have you ever smoked?*** Quit date if applicable? ***  How many packs a day do/did you smoke? *** Do you use smokeless tobacco?*** Do you use an illicit drugs?*** On average, how many days per week do you engage in moderate to strenuous exercise (like a brisk walk)?*** On average, how many minutes do you engage in exercise at this level?*** Are you sexually active? ***Number of partners?*** Typical breakfast**** Typical lunch*** Typical dinner*** Typical  snacks:****  Beverages: ***  Answer theses question about your everyday activities: Can you perform most household chores?*** Are you deaf or have significant trouble hearing?*** Do you feel that you have a problem with memory?*** Do you feel safe at home?*** Last dentist visit?*** 8. Do you have any difficulty performing your everyday activities?*** Are you having any difficulty walking, taking medications on your own, and or difficulty managing daily home needs?*** Do you have difficulty walking or climbing stairs?*** Do you have difficulty dressing or bathing?*** Do you have difficulty doing errands alone such as visiting a doctor's office or shopping?*** Do you currently have any difficulty preparing food and eating?*** Do you currently have any difficulty using the toilet?*** Do you have any difficulty managing your finances?*** Do you have any difficulties with housekeeping of managing your housekeeping?***   Do you have Advanced Directives in place (Living Will, Healthcare Power or Attorney)? ***   Last eye Exam and location?***   Do you currently use prescribed or non-prescribed narcotic or opioid pain medications?***  Do you have a history or close family history of breast, ovarian, tubal or peritoneal cancer or a family member with BRCA (breast cancer susceptibility 1 and 2) gene mutations?  ***Request home vitals (wt, BP, etc.) and enter into vitals, THEN update Vital Signs SmartPhrase below at the top of the HPI. See below.   Nurse/Assistant Credentials/time stamp:    ----------------------------------------------------------------------------------------------------------------------------------------------------------------------------------------------------------------------  Because this visit was a virtual/telehealth visit, some criteria may be missing or patient reported. Any vitals not documented were not able to be obtained and vitals that  have been  documented are patient reported.    MEDICARE ANNUAL PREVENTIVE VISIT WITH PROVIDER: (Welcome to Medicare, initial annual wellness or annual wellness exam)  Virtual Visit via Video***Phone Note  I connected with Kathy Carpenter on 06/09/24 by phone *** a video enabled telemedicine application and verified that I am speaking with the correct person using two identifiers.  Location patient: home Location provider:work or home office Persons participating in the virtual visit: patient, provider  Concerns and/or follow up today:   See HM section in Epic for other details of completed HM.    ROS: negative for report of fevers, unintentional weight loss, vision changes, vision loss, hearing loss or change, chest pain, sob, hemoptysis, melena, hematochezia, hematuria, falls, bleeding or bruising, thoughts of suicide or self harm, memory loss  Patient-completed extensive health risk assessment - reviewed and discussed with the patient: See Health Risk Assessment completed with patient prior to the visit either above or in recent phone note. This was reviewed in detailed with the patient today and appropriate recommendations, orders and referrals were placed as needed per Summary below and patient instructions.   Review of Medical History: -PMH, PSH, Family History and current specialty and care providers reviewed and updated and listed below   Patient Care Team: Johnny Garnette LABOR, MD as PCP - General (Family Medicine)   Past Medical History:  Diagnosis Date   Breast cancer [facility])    Cervical intraepithelial neoplasia grade 3 2021   Colon cancer (HCC)    Depression    Dyslipidemia    Female cystocele    High grade squamous intraepithelial lesion (HGSIL), grade 3 CIN, on biopsy of cervix 06/2020   History of colon cancer 2006   s/p  sigmoid colectomy for large cancerous polyp 12-18-2004;  per pt no chemo or radiation and no recurrence   History of left breast cancer 2004   dx DCIS  high grade;  s/p  left breast lumpectomy w/ node dissection 06-05-2003 and completed chemo/ radiation 2004;  per pt no recurrence and released by oncologist approx. 2014   HTN (hypertension)    followed by pcp   Wears glasses     Past Surgical History:  Procedure Laterality Date   BREAST LUMPECTOMY WITH NEEDLE LOCALIZATION AND AXILLARY LYMPH NODE DISSECTION Left 06-05-2003   @[facility]    BREAST SURGERY     CATARACT EXTRACTION W/ INTRAOCULAR LENS  IMPLANT, BILATERAL  yrs ago   CERVICAL CONIZATION W/BX N/A 06/25/2020   Procedure: CONIZATION CERVIX WITH BIOPSY (cold knife cone);  Surgeon: Marne Kelly Nest, MD;  Location: [facility];  Service: Gynecology;  Laterality: N/A;   COLON SURGERY     COLONOSCOPY  last one approx. 2015   CYSTOCELE REPAIR N/A 06/25/2020   Procedure: ANTERIOR REPAIR (CYSTOCELE);  Surgeon: Marne Kelly Nest, MD;  Location: [facility];  Service: Gynecology;  Laterality: N/A;   CYSTOSCOPY  06/25/2020   Procedure: CYSTOSCOPY;  Surgeon: Marne Kelly Nest, MD;  Location: [facility];  Service: Gynecology;;   LAPAROSCOPIC BILATERAL SALPINGO OOPHERECTOMY  10-08-2005   @[facility]    LAPAROSCOPIC SIGMOID COLECTOMY  12-18-2004   @[facility]    PARTIAL COLECTOMY      Social History   Socioeconomic History   Marital status: Single    Spouse name: Not on file   Number of children: Not on file   Years of education: Not on file   Highest education level: 12th grade  Occupational History   Not on file  Tobacco Use   Smoking status: Never   Smokeless tobacco: Never  Substance and Sexual Activity   Alcohol use: Yes    Comment: occasional   Drug use: Never   Sexual activity: Not Currently    Birth control/protection: Post-menopausal  Other Topics Concern   Not on file  Social History Narrative   Not on file   Social Drivers of Health   Financial Resource Strain: Low Risk  (06/08/2024)   Overall Financial Resource Strain (CARDIA)     Difficulty of Paying Living Expenses: Not hard at all  Food Insecurity: No Food Insecurity (06/08/2024)   Hunger Vital Sign    Worried About Running Out of Food in the Last Year: Never true    Ran Out of Food in the Last Year: Never true  Transportation Needs: No Transportation Needs (06/08/2024)   PRAPARE - Administrator, Civil Service (Medical): No    Lack of Transportation (Non-Medical): No  Physical Activity: Inactive (06/08/2024)   Exercise Vital Sign    Days of Exercise per Week: 0 days    Minutes of Exercise per Session: Not on file  Stress: No Stress Concern Present (06/08/2024)   [employer] of Occupational Health - Occupational Stress Questionnaire    Feeling of Stress: Only a little  Social Connections: Moderately Isolated (06/08/2024)   Social Connection and Isolation Panel    Frequency of Communication with Friends and Family: More than three times a week    Frequency of Social Gatherings with Friends and Family: More than three times a week    Attends Religious Services: Patient declined    Database administrator or Organizations: Yes    Attends Banker Meetings: 1 to 4 times per year    Marital Status: Divorced  Intimate Partner Violence: Unknown (01/10/2022)   Received from [facility]   HITS    Physically Hurt: Not on file    Insult or Talk Down To: Not on file    Threaten Physical Harm: Not on file    Scream or Curse: Not on file    No family history on file.  Current Outpatient Medications on File Prior to Visit  Medication Sig Dispense Refill   amLODipine  (NORVASC ) 5 MG tablet TAKE 1 AND 1/2 TABLETS(7.5 MG) BY MOUTH AT BEDTIME 135 tablet 3   Ascorbic Acid (VITAMIN C PO) Take by mouth at bedtime.     benzonatate  (TESSALON ) 100 MG capsule Take 1 capsule (100 mg total) by mouth 2 (two) times daily as needed for cough. 20 capsule 0   citalopram  (CELEXA ) 20 MG tablet Take 1 tablet (20 mg total) by mouth daily. 90 tablet 3   lisinopril   (ZESTRIL ) 40 MG tablet TAKE 1 TABLET(40 MG) BY MOUTH DAILY IN THE AFTERNOON 90 tablet 3   Multiple Vitamin (MULTIVITAMIN ADULT PO) Take by mouth.     traZODone  (DESYREL ) 50 MG tablet TAKE 1 TABLET(50 MG) BY MOUTH AT BEDTIME 90 tablet 1   No current facility-administered medications on file prior to visit.    No Known Allergies     Physical Exam Vitals requested from patient and listed below if patient had equipment and was able to obtain at home for this virtual visit: There were no vitals filed for this visit. Estimated body mass index is 24.84 kg/m as calculated from the following:   Height as of 08/04/23: 5' 4.5 (1.638 m).   Weight as of 09/17/23: 147 lb (66.7 kg).  EKG (optional): deferred  due to virtual visit  GENERAL: alert, oriented, no acute distress detected, full vision exam deferred due to pandemic and/or virtual encounter  *** HEENT: atraumatic, conjunttiva clear, no obvious abnormalities on inspection of external nose and ears  NECK: normal movements of the head and neck  LUNGS: on inspection no signs of respiratory distress, breathing rate appears normal, no obvious gross SOB, gasping or wheezing  CV: no obvious cyanosis  MS: moves all visible extremities without noticeable abnormality  PSYCH/NEURO: pleasant and cooperative, no obvious depression or anxiety, speech and thought processing grossly intact, Cognitive function grossly intact  Flowsheet Row Video Visit from 09/17/2023 in [facility]  PHQ-9 Total Score 0        09/17/2023    1:35 PM  Depression screen PHQ 2/9  Decreased Interest 0  Down, Depressed, Hopeless 0  PHQ - 2 Score 0  Altered sleeping 0  Tired, decreased energy 0  Change in appetite 0  Feeling bad or failure about yourself  0  Trouble concentrating 0  Moving slowly or fidgety/restless 0  Suicidal thoughts 0  PHQ-9 Score 0       09/17/2023    1:35 PM 06/08/2024    2:41 PM  Fall Risk  Falls in  the past year? 0 0  Was there an injury with Fall? 0 0  Fall Risk Category Calculator 0 0   Fall risk Follow up Falls evaluation completed      Patient-reported     SUMMARY AND PLAN:  No diagnosis found.  Visit coding *** [phone] (annual wellness visit -initial); G0439 (annual wellness subsequent); G0402 Welcome to Medicare(initial preventive physical exam)   Discussed applicable health maintenance/preventive health measures and advised and referred or ordered per patient preferences:  Health Maintenance  Topic Date Due   DTaP/Tdap/Td (1 - Tdap) Never done   Zoster Vaccines- Shingrix (1 of 2) 08/26/1956   Medicare Annual Wellness (AWV)  07/01/2023   INFLUENZA VACCINE  05/06/2024   COVID-19 Vaccine (4 - 2025-26 season) 06/06/2024   Pneumococcal Vaccine: 50+ Years  Completed   DEXA SCAN  Completed   HPV VACCINES  Aged Out   Meningococcal B Vaccine  Aged Out      Education and counseling on the following was provided based on the above review of health and a plan/checklist for the patient, along with additional information discussed, was provided for the patient in the patient instructions :  -Advised on importance of completing advanced directives, discussed options for completing and provided information in patient instructions as well -Provided counseling and plan for difficulty hearing  -Provided counseling and plan for increased risk of falling if applicable per above screening. Reviewed and demonstrated safe balance exercises that can be done at home to improve balance and discussed exercise guidelines for adults with include balance exercises at least 3 days per week.  -Advised and counseled on a healthy lifestyle - including the importance of a healthy diet, regular physical activity, social connections and stress management. -Reviewed patient's current diet. Advised and counseled on a whole foods based healthy diet. A summary of a healthy diet was provided in the Patient  Instructions.  -reviewed patient's current physical activity level and discussed exercise guidelines for adults. Discussed community resources and ideas for safe exercise at home to assist in meeting exercise guideline recommendations in a safe and healthy way.  -Advise yearly dental visits at minimum and regular eye exams -Advised and counseled on alcohol safe limits, risks/ tobacco  use, risks of smoking and offered counseling/help, drug, opoid use/misuse   Follow up: see patient instructions     There are no Patient Instructions on file for this visit.  Chiquita JONELLE Cramp, DO

## 2024-07-26 ENCOUNTER — Encounter: Admitting: Family Medicine

## 2024-08-02 ENCOUNTER — Ambulatory Visit: Admitting: Family Medicine

## 2024-08-02 ENCOUNTER — Ambulatory Visit: Payer: Self-pay | Admitting: Family Medicine

## 2024-08-02 ENCOUNTER — Encounter: Payer: Self-pay | Admitting: Family Medicine

## 2024-08-02 VITALS — BP 98/60 | HR 71 | Temp 97.7°F | Ht 64.5 in | Wt 143.4 lb

## 2024-08-02 DIAGNOSIS — M150 Primary generalized (osteo)arthritis: Secondary | ICD-10-CM | POA: Diagnosis not present

## 2024-08-02 DIAGNOSIS — E785 Hyperlipidemia, unspecified: Secondary | ICD-10-CM

## 2024-08-02 DIAGNOSIS — G4700 Insomnia, unspecified: Secondary | ICD-10-CM

## 2024-08-02 DIAGNOSIS — R739 Hyperglycemia, unspecified: Secondary | ICD-10-CM | POA: Diagnosis not present

## 2024-08-02 DIAGNOSIS — I10 Essential (primary) hypertension: Secondary | ICD-10-CM

## 2024-08-02 DIAGNOSIS — Z23 Encounter for immunization: Secondary | ICD-10-CM | POA: Diagnosis not present

## 2024-08-02 LAB — CBC WITH DIFFERENTIAL/PLATELET
Basophils Absolute: 0.1 K/uL (ref 0.0–0.1)
Basophils Relative: 0.8 % (ref 0.0–3.0)
Eosinophils Absolute: 0.2 K/uL (ref 0.0–0.7)
Eosinophils Relative: 1.8 % (ref 0.0–5.0)
HCT: 36.1 % (ref 36.0–46.0)
Hemoglobin: 11.6 g/dL — ABNORMAL LOW (ref 12.0–15.0)
Lymphocytes Relative: 30.1 % (ref 12.0–46.0)
Lymphs Abs: 2.9 K/uL (ref 0.7–4.0)
MCHC: 32.3 g/dL (ref 30.0–36.0)
MCV: 79.4 fl (ref 78.0–100.0)
Monocytes Absolute: 0.7 K/uL (ref 0.1–1.0)
Monocytes Relative: 6.9 % (ref 3.0–12.0)
Neutro Abs: 5.8 K/uL (ref 1.4–7.7)
Neutrophils Relative %: 60.4 % (ref 43.0–77.0)
Platelets: 578 K/uL — ABNORMAL HIGH (ref 150.0–400.0)
RBC: 4.54 Mil/uL (ref 3.87–5.11)
RDW: 14.2 % (ref 11.5–15.5)
WBC: 9.7 K/uL (ref 4.0–10.5)

## 2024-08-02 LAB — HEPATIC FUNCTION PANEL
ALT: 13 U/L (ref 0–35)
AST: 20 U/L (ref 0–37)
Albumin: 3.8 g/dL (ref 3.5–5.2)
Alkaline Phosphatase: 88 U/L (ref 39–117)
Bilirubin, Direct: 0.1 mg/dL (ref 0.0–0.3)
Total Bilirubin: 0.3 mg/dL (ref 0.2–1.2)
Total Protein: 6.9 g/dL (ref 6.0–8.3)

## 2024-08-02 LAB — LIPID PANEL
Cholesterol: 166 mg/dL (ref 0–200)
HDL: 38.7 mg/dL — ABNORMAL LOW (ref >39.00)
LDL Cholesterol: 102 mg/dL — ABNORMAL HIGH (ref 0–99)
NonHDL: 127.35
Total CHOL/HDL Ratio: 4
Triglycerides: 127 mg/dL (ref 0.0–149.0)
VLDL: 25.4 mg/dL (ref 0.0–40.0)

## 2024-08-02 LAB — HEMOGLOBIN A1C: Hgb A1c MFr Bld: 5.6 % (ref 4.6–6.5)

## 2024-08-02 LAB — BASIC METABOLIC PANEL WITH GFR
BUN: 15 mg/dL (ref 6–23)
CO2: 28 meq/L (ref 19–32)
Calcium: 9.2 mg/dL (ref 8.4–10.5)
Chloride: 102 meq/L (ref 96–112)
Creatinine, Ser: 0.98 mg/dL (ref 0.40–1.20)
GFR: 52.11 mL/min — ABNORMAL LOW (ref >60.00)
Glucose, Bld: 98 mg/dL (ref 70–99)
Potassium: 4.5 meq/L (ref 3.5–5.1)
Sodium: 138 meq/L (ref 135–145)

## 2024-08-02 LAB — TSH: TSH: 1.17 u[IU]/mL (ref 0.35–5.50)

## 2024-08-02 MED ORDER — CITALOPRAM HYDROBROMIDE 20 MG PO TABS: 20.0000 mg | ORAL_TABLET | Freq: Every day | ORAL | 3 refills | Status: AC

## 2024-08-02 MED ORDER — TRAZODONE HCL 50 MG PO TABS: 50.0000 mg | ORAL_TABLET | Freq: Every day | ORAL | 3 refills | Status: AC

## 2024-08-02 NOTE — Addendum Note (Signed)
 Addended by: LADONNA INOCENTE SAILOR on: 08/02/2024 09:46 AM   Modules accepted: Orders

## 2024-08-02 NOTE — Progress Notes (Signed)
 Subjective:    Patient ID: Kathy Carpenter, female    DOB: September 23, 1935, 89 y.o.   MRN: [id number]  HPI Here to follow up on issues. She feels well in general. Her OA is stable. She is sleeping well. Her BP is stable.    Review of Systems  Constitutional: Negative.   HENT: Negative.    Eyes: Negative.   Respiratory: Negative.    Cardiovascular: Negative.   Gastrointestinal: Negative.   Genitourinary:  Negative for decreased urine volume, difficulty urinating, dyspareunia, dysuria, enuresis, flank pain, frequency, hematuria, pelvic pain and urgency.  Musculoskeletal:  Positive for arthralgias.  Skin: Negative.   Neurological: Negative.  Negative for headaches.  Psychiatric/Behavioral: Negative.         Objective:   Physical Exam Constitutional:      General: She is not in acute distress.    Appearance: Normal appearance. She is well-developed.  HENT:     Head: Normocephalic and atraumatic.     Right Ear: External ear normal.     Left Ear: External ear normal.     Nose: Nose normal.     Mouth/Throat:     Pharynx: No oropharyngeal exudate.  Eyes:     General: No scleral icterus.    Conjunctiva/sclera: Conjunctivae normal.     Pupils: Pupils are equal, round, and reactive to light.  Neck:     Thyroid : No thyromegaly.     Vascular: No JVD.  Cardiovascular:     Rate and Rhythm: Normal rate and regular rhythm.     Pulses: Normal pulses.     Heart sounds: Normal heart sounds. No murmur heard.    No friction rub. No gallop.  Pulmonary:     Effort: Pulmonary effort is normal. No respiratory distress.     Breath sounds: Normal breath sounds. No wheezing or rales.  Chest:     Chest wall: No tenderness.  Abdominal:     General: Bowel sounds are normal. There is no distension.     Palpations: Abdomen is soft. There is no mass.     Tenderness: There is no abdominal tenderness. There is no guarding or rebound.  Musculoskeletal:        General: No tenderness. Normal  range of motion.     Cervical back: Normal range of motion and neck supple.  Lymphadenopathy:     Cervical: No cervical adenopathy.  Skin:    General: Skin is warm and dry.     Findings: No erythema or rash.  Neurological:     General: No focal deficit present.     Mental Status: She is alert and oriented to person, place, and time.     Cranial Nerves: No cranial nerve deficit.     Motor: No abnormal muscle tone.     Coordination: Coordination normal.     Deep Tendon Reflexes: Reflexes are normal and symmetric. Reflexes normal.  Psychiatric:        Mood and Affect: Mood normal.        Behavior: Behavior normal.        Thought Content: Thought content normal.        Judgment: Judgment normal.           Assessment & Plan:  Her HTN and insomnia are well controlled. We will get labs to check lipids, etc. I personally spent a total of 32 minutes in the care of the patient today including getting/reviewing separately obtained history, performing a medically appropriate exam/evaluation, and placing orders.  Garnette Olmsted, MD

## 2024-08-05 ENCOUNTER — Encounter: Payer: Self-pay | Admitting: Family Medicine

## 2024-08-05 NOTE — Telephone Encounter (Signed)
 Pt notified of update

## 2024-08-10 ENCOUNTER — Telehealth: Payer: Self-pay

## 2024-08-10 ENCOUNTER — Encounter: Payer: Self-pay | Admitting: Family Medicine

## 2024-08-10 ENCOUNTER — Ambulatory Visit: Payer: Self-pay

## 2024-08-10 ENCOUNTER — Telehealth (INDEPENDENT_AMBULATORY_CARE_PROVIDER_SITE_OTHER): Admitting: Family Medicine

## 2024-08-10 ENCOUNTER — Other Ambulatory Visit (HOSPITAL_COMMUNITY): Payer: Self-pay

## 2024-08-10 DIAGNOSIS — J40 Bronchitis, not specified as acute or chronic: Secondary | ICD-10-CM | POA: Diagnosis not present

## 2024-08-10 MED ORDER — HYDROCODONE BIT-HOMATROP MBR 5-1.5 MG/5ML PO SOLN: 5.0000 mL | ORAL | 0 refills | Status: AC | PRN

## 2024-08-10 MED ORDER — AZITHROMYCIN 250 MG PO TABS
ORAL_TABLET | ORAL | 0 refills | Status: AC
Start: 2024-08-10 — End: ?

## 2024-08-10 NOTE — Progress Notes (Signed)
 Subjective:    Patient ID: Kathy Carpenter, female    DOB: 16-Sep-1935, 89 y.o.   MRN: [id number]  HPI Virtual Visit via Video Note  I connected with the patient on 08/10/24 at 11:00 AM EST by a video enabled telemedicine application and verified that I am speaking with the correct person using two identifiers.  Location patient: home Location provider:work or home office Persons participating in the virtual visit: patient, provider  I discussed the limitations of evaluation and management by telemedicine and the availability of in person appointments. The patient expressed understanding and agreed to proceed.   HPI: Here with her daughter for 3 weeks of a cough that produces clear sputum. No fever or SOB. Using Nyquil and Dayquil.   ROS: See pertinent positives and negatives per HPI.  Past Medical History:  Diagnosis Date   Breast cancer (HCC)    Cervical intraepithelial neoplasia grade 3 2021   Colon cancer (HCC)    Depression    Dyslipidemia    Female cystocele    High grade squamous intraepithelial lesion (HGSIL), grade 3 CIN, on biopsy of cervix 06/2020   History of colon cancer 2006   s/p  sigmoid colectomy for large cancerous polyp 12-18-2004;  per pt no chemo or radiation and no recurrence   History of left breast cancer 2004   dx DCIS high grade;  s/p  left breast lumpectomy w/ node dissection 06-05-2003 and completed chemo/ radiation 2004;  per pt no recurrence and released by oncologist approx. 2014   HTN (hypertension)    followed by pcp   Wears glasses     Past Surgical History:  Procedure Laterality Date   BREAST LUMPECTOMY WITH NEEDLE LOCALIZATION AND AXILLARY LYMPH NODE DISSECTION Left 06-05-2003   @[facility]    BREAST SURGERY     CATARACT EXTRACTION W/ INTRAOCULAR LENS  IMPLANT, BILATERAL  yrs ago   CERVICAL CONIZATION W/BX N/A 06/25/2020   Procedure: CONIZATION CERVIX WITH BIOPSY (cold knife cone);  Surgeon: Marne Kelly Nest, MD;  Location[facility];  Service: Gynecology;  Laterality: N/A;   COLON SURGERY     COLONOSCOPY  last one approx. 2015   CYSTOCELE REPAIR N/A 06/25/2020   Procedure: ANTERIOR REPAIR (CYSTOCELE);  Surgeon: Marne Kelly Nest, MD;  Location: [facility];  Service: Gynecology;  Laterality: N/A;   CYSTOSCOPY  06/25/2020   Procedure: CYSTOSCOPY;  Surgeon: Marne Kelly Nest, MD;  Location: [facility];  Service: Gynecology;;   LAPAROSCOPIC BILATERAL SALPINGO OOPHERECTOMY  10-08-2005   @[facility]    LAPAROSCOPIC SIGMOID COLECTOMY  12-18-2004   @[facility]    PARTIAL COLECTOMY      History reviewed. No pertinent family history.   Current Outpatient Medications:    amLODipine  (NORVASC ) 5 MG tablet, TAKE 1 AND 1/2 TABLETS(7.5 MG) BY MOUTH AT BEDTIME, Disp: 135 tablet, Rfl: 3   Ascorbic Acid (VITAMIN C PO), Take by mouth at bedtime., Disp: , Rfl:    citalopram  (CELEXA ) 20 MG tablet, Take 1 tablet (20 mg total) by mouth daily., Disp: 90 tablet, Rfl: 3   lisinopril  (ZESTRIL ) 40 MG tablet, TAKE 1 TABLET(40 MG) BY MOUTH DAILY IN THE AFTERNOON, Disp: 90 tablet, Rfl: 3   Multiple Vitamin (MULTIVITAMIN ADULT PO), Take by mouth., Disp: , Rfl:    traZODone  (DESYREL ) 50 MG tablet, Take 1 tablet (50 mg total) by mouth at bedtime., Disp: 90 tablet, Rfl: 3  EXAM:  VITALS per patient if applicable:  GENERAL: alert, oriented, appears well  and in no acute distress  HEENT: atraumatic, conjunttiva clear, no obvious abnormalities on inspection of external nose and ears  NECK: normal movements of the head and neck  LUNGS: on inspection no signs of respiratory distress, breathing rate appears normal, no obvious gross SOB, gasping or wheezing  CV: no obvious cyanosis  MS: moves all visible extremities without noticeable abnormality  PSYCH/NEURO: pleasant and cooperative, no obvious depression or anxiety, speech and thought processing grossly intact  ASSESSMENT AND PLAN: Bronchitis, treat  with a Zpack. Use Hycodan syrup as needed.  Garnette Olmsted, MD  Discussed the following assessment and plan:  No diagnosis found.     I discussed the assessment and treatment plan with the patient. The patient was provided an opportunity to ask questions and all were answered. The patient agreed with the plan and demonstrated an understanding of the instructions.   The patient was advised to call back or seek an in-person evaluation if the symptoms worsen or if the condition fails to improve as anticipated.      Review of Systems     Objective:   Physical Exam        Assessment & Plan:

## 2024-08-10 NOTE — Telephone Encounter (Signed)
 Noted

## 2024-08-10 NOTE — Telephone Encounter (Signed)
 Pharmacy Patient Advocate Encounter   Received notification from Patient Advice Request messages that prior authorization for Hycodan syrup is required/requested.   Insurance verification completed.   The patient is insured through [facility]   Per test claim: PA required; PA started via CoverMyMeds. KEY [id number] . Waiting for clinical questions to populate.

## 2024-08-10 NOTE — Telephone Encounter (Signed)
 Clinical questions have been answered and PA submitted. PA currently Pending. Please be advised that most companies allow up to 30 days to make a decision. We will advise when a determination has been made, or follow up in 1 week.   Please reach out to our team, Rx Prior Auth Pool, if you haven't heard back in a week.

## 2024-08-10 NOTE — Telephone Encounter (Signed)
 Copied from CRM [id number]. Topic: General - Other >> Aug 10, 2024 12:03 PM Rosina BIRCH wrote: Reason for CRM: patient daughter called stating the patient need prior authorization for cough medicine that was called in today by the provider [phone]

## 2024-08-10 NOTE — Telephone Encounter (Signed)
 FYI Only or Action Required?: Action required by provider: request for appointment.  Patient was last seen in primary care on 08/02/2024 by Johnny Garnette LABOR, MD.  Called Nurse Triage reporting Cough.  Symptoms began several weeks ago.  Interventions attempted: Nothing.  Symptoms are: gradually worsening.  Triage Disposition: See Physician Within 24 Hours  Patient/caregiver understands and will follow disposition?: Yes  FYI Only or Action Required?: Action required by provider: request for appointment.  Patient was last seen in primary care on 08/02/2024 by Johnny Garnette LABOR, MD.  Called Nurse Triage reporting Cough.  Symptoms began several weeks ago.  Interventions attempted: OTC medications:  SABRA  Symptoms are: unchanged.  Triage Disposition: See Physician Within 24 Hours  Patient/caregiver understands and will follow disposition?: Yes   Copied from CRM #[id number]. Topic: Clinical - Red Word Triage >> Aug 10, 2024  8:31 AM Eva FALCON wrote: Red Word that prompted transfer to Nurse Triage: She has been sick for the last 3 weeks, having severe cough to the point she throws up mucus, states it was green at first, and now its starting to come out white. Answer Assessment - Initial Assessment Questions 1. ONSET: When did the cough begin?      3 WEEKS 2. SEVERITY: How bad is the cough today?      severe 3. SPUTUM: Describe the color of your sputum (e.g., none, dry cough; clear, white, yellow, green)     Was green but white now 4. HEMOPTYSIS: Are you coughing up any blood? If Yes, ask: How much? (e.g., flecks, streaks, tablespoons, etc.)     no 5. DIFFICULTY BREATHING: Are you having difficulty breathing? If Yes, ask: How bad is it? (e.g., mild, moderate, severe)      no 6. FEVER: Do you have a fever? If Yes, ask: What is your temperature, how was it measured, and when did it start?     no 7. CARDIAC HISTORY: Do you have any history of heart disease? (e.g., heart  attack, congestive heart failure)      no 8. LUNG HISTORY: Do you have any history of lung disease?  (e.g., pulmonary embolus, asthma, emphysema)     no 9. PE RISK FACTORS: Do you have a history of blood clots? (or: recent major surgery, recent prolonged travel, bedridden)     no 10. OTHER SYMPTOMS: Do you have any other symptoms? (e.g., runny nose, wheezing, chest pain)       no 11. PREGNANCY: Is there any chance you are pregnant? When was your last menstrual period?       no 12. TRAVEL: Have you traveled out of the country in the last month? (e.g., travel history, exposures)       no  Protocols used: Cough - Acute Productive-A-AH  Reason for Disposition  [1] Continuous (nonstop) coughing interferes with work or school AND [2] no improvement using cough treatment per Care Advice  Answer Assessment - Initial Assessment Questions 1. ONSET: When did the cough begin?      3 WEEKS 2. SEVERITY: How bad is the cough today?      severe 3. SPUTUM: Describe the color of your sputum (e.g., none, dry cough; clear, white, yellow, green)     Was green but white now 4. HEMOPTYSIS: Are you coughing up any blood? If Yes, ask: How much? (e.g., flecks, streaks, tablespoons, etc.)     no 5. DIFFICULTY BREATHING: Are you having difficulty breathing? If Yes, ask: How bad is it? (e.g., mild, moderate,  severe)      no 6. FEVER: Do you have a fever? If Yes, ask: What is your temperature, how was it measured, and when did it start?     no 7. CARDIAC HISTORY: Do you have any history of heart disease? (e.g., heart attack, congestive heart failure)      no 8. LUNG HISTORY: Do you have any history of lung disease?  (e.g., pulmonary embolus, asthma, emphysema)     no 9. PE RISK FACTORS: Do you have a history of blood clots? (or: recent major surgery, recent prolonged travel, bedridden)     no 10. OTHER SYMPTOMS: Do you have any other symptoms? (e.g., runny nose, wheezing,  chest pain)       no 11. PREGNANCY: Is there any chance you are pregnant? When was your last menstrual period?       no 12. TRAVEL: Have you traveled out of the country in the last month? (e.g., travel history, exposures)       no  Protocols used: Cough - Acute Productive-A-AH

## 2024-08-10 NOTE — Telephone Encounter (Signed)
 Please send  a  PA for this Rx HYDROcodone bit-homatropine (HYCODAN) 5-1.5 MG/5ML syrup

## 2024-08-11 ENCOUNTER — Other Ambulatory Visit (HOSPITAL_COMMUNITY): Payer: Self-pay

## 2024-08-11 NOTE — Telephone Encounter (Signed)
 Noted

## 2024-08-11 NOTE — Telephone Encounter (Signed)
 Pharmacy Patient Advocate Encounter  Received notification from [employer] that Prior Authorization for Hycodan syrup has been DENIED.  Full denial letter will be uploaded to the media tab. See denial reason below.   Pt copay $56.59  PA #/Case ID/Reference #: # [id number]

## 2024-08-12 MED ORDER — BENZONATATE 200 MG PO CAPS: 200.0000 mg | ORAL_CAPSULE | Freq: Four times a day (QID) | ORAL | 1 refills | Status: AC | PRN

## 2024-08-12 NOTE — Telephone Encounter (Signed)
 Spoke with pt aware of Dr Johnny advise, pt states that her daughter picked up the cough syrup at the pharmacy already.

## 2024-08-12 NOTE — Telephone Encounter (Signed)
 She can pay for this with cash if she wants to . Otherwise I am sending in some Benzonatate  pills she can try

## 2024-12-23 ENCOUNTER — Ambulatory Visit
